# Patient Record
Sex: Female | Born: 1960
Health system: Southern US, Community
[De-identification: ages and names within clinical notes are randomized; demographics above are authoritative.]

## PROBLEM LIST (undated history)

## (undated) DIAGNOSIS — K8020 Calculus of gallbladder without cholecystitis without obstruction: Secondary | ICD-10-CM

## (undated) DIAGNOSIS — K219 Gastro-esophageal reflux disease without esophagitis: Secondary | ICD-10-CM

## (undated) DIAGNOSIS — E785 Hyperlipidemia, unspecified: Secondary | ICD-10-CM

## (undated) DIAGNOSIS — R918 Other nonspecific abnormal finding of lung field: Secondary | ICD-10-CM

## (undated) DIAGNOSIS — C20 Malignant neoplasm of rectum: Principal | ICD-10-CM

## (undated) DIAGNOSIS — N281 Cyst of kidney, acquired: Secondary | ICD-10-CM

## (undated) HISTORY — DX: Hyperlipidemia, unspecified: E78.5

## (undated) HISTORY — PX: BIOPSY BREAST: PRO8

## (undated) HISTORY — PX: MULTIPLE TOOTH EXTRACTIONS: SHX2053

## (undated) HISTORY — DX: Malignant neoplasm of rectum: C20

## (undated) HISTORY — PX: FOOT GANGLION EXCISION: SHX1660

## (undated) HISTORY — PX: POLYPECTOMY: SHX149

## (undated) HISTORY — DX: Gastro-esophageal reflux disease without esophagitis: K21.9

---

## 1998-07-07 ENCOUNTER — Other Ambulatory Visit: Admission: RE | Admit: 1998-07-07 | Discharge: 1998-07-07 | Payer: Self-pay | Admitting: Obstetrics and Gynecology

## 1999-07-16 ENCOUNTER — Other Ambulatory Visit: Admission: RE | Admit: 1999-07-16 | Discharge: 1999-07-16 | Payer: Self-pay | Admitting: Obstetrics and Gynecology

## 1999-09-17 HISTORY — PX: INGUINAL HERNIA REPAIR: SUR1180

## 2000-08-22 ENCOUNTER — Other Ambulatory Visit: Admission: RE | Admit: 2000-08-22 | Discharge: 2000-08-22 | Payer: Self-pay | Admitting: Obstetrics and Gynecology

## 2001-12-17 ENCOUNTER — Other Ambulatory Visit: Admission: RE | Admit: 2001-12-17 | Discharge: 2001-12-17 | Payer: Self-pay | Admitting: Obstetrics and Gynecology

## 2002-01-11 ENCOUNTER — Ambulatory Visit (HOSPITAL_BASED_OUTPATIENT_CLINIC_OR_DEPARTMENT_OTHER): Admission: RE | Admit: 2002-01-11 | Discharge: 2002-01-11 | Payer: Self-pay | Admitting: *Deleted

## 2003-01-11 ENCOUNTER — Other Ambulatory Visit: Admission: RE | Admit: 2003-01-11 | Discharge: 2003-01-11 | Payer: Self-pay | Admitting: Obstetrics and Gynecology

## 2004-02-14 ENCOUNTER — Other Ambulatory Visit: Admission: RE | Admit: 2004-02-14 | Discharge: 2004-02-14 | Payer: Self-pay | Admitting: Obstetrics and Gynecology

## 2005-01-29 ENCOUNTER — Other Ambulatory Visit: Admission: RE | Admit: 2005-01-29 | Discharge: 2005-01-29 | Payer: Self-pay | Admitting: Obstetrics and Gynecology

## 2007-01-02 ENCOUNTER — Ambulatory Visit (HOSPITAL_COMMUNITY): Admission: RE | Admit: 2007-01-02 | Discharge: 2007-01-02 | Payer: Self-pay | Admitting: Orthopedic Surgery

## 2007-02-04 ENCOUNTER — Ambulatory Visit (HOSPITAL_BASED_OUTPATIENT_CLINIC_OR_DEPARTMENT_OTHER): Admission: RE | Admit: 2007-02-04 | Discharge: 2007-02-04 | Payer: Self-pay | Admitting: Orthopedic Surgery

## 2010-04-11 ENCOUNTER — Encounter (INDEPENDENT_AMBULATORY_CARE_PROVIDER_SITE_OTHER): Payer: Self-pay | Admitting: *Deleted

## 2010-10-16 NOTE — Letter (Signed)
Summary: Previsit letter  [facility]  [address] [place], [place] [address]   Phone: [phone]  Fax: [phone]       04/11/2010 MRN: [id number]  [facility] [address] [place], [place]  [address]  Dear Ms. Monteverde,  Welcome to the Gastroenterology Division at [facility]    You are scheduled to see a nurse for your pre-procedure visit on 05/17/2010 at 10:30AM on the 3rd floor at [facility], [address]  We ask that you try to arrive at our office 15 minutes prior to your appointment time to allow for check-in.  Your nurse visit will consist of discussing your medical and surgical history, your immediate family medical history, and your medications.    Please bring a complete list of all your medications or, if you prefer, bring the medication bottles and we will list them.  We will need to be aware of both prescribed and over the counter drugs.  We will need to know exact dosage information as well.  If you are on blood thinners (Coumadin, Plavix, Aggrenox, Ticlid, etc.) please call our office today/prior to your appointment, as we need to consult with your physician about holding your medication.   Please be prepared to read and sign documents such as consent forms, a financial agreement, and acknowledgement forms.  If necessary, and with your consent, a friend or relative is welcome to sit-in on the nurse visit with you.  Please bring your insurance card so that we may make a copy of it.  If your insurance requires a referral to see a specialist, please bring your referral form from your primary care physician.  No co-pay is required for this nurse visit.     If you cannot keep your appointment, please call [phone] to cancel or reschedule prior to your appointment date.  This allows [place] the opportunity to schedule an appointment for another patient in need of care.    Thank you for choosing [facility] for your medical needs.  We  appreciate the opportunity to care for you.  Please visit [place] at our website  to learn more about our practice.                     Sincerely.                                                                                                                   The Gastroenterology Division

## 2011-10-01 ENCOUNTER — Ambulatory Visit (AMBULATORY_SURGERY_CENTER): Payer: 59 | Admitting: *Deleted

## 2011-10-01 VITALS — Ht 62.0 in | Wt 132.0 lb

## 2011-10-01 DIAGNOSIS — Z1211 Encounter for screening for malignant neoplasm of colon: Secondary | ICD-10-CM

## 2011-10-01 MED ORDER — PEG-KCL-NACL-NASULF-NA ASC-C 100 G PO SOLR: ORAL | Status: DC

## 2011-10-15 ENCOUNTER — Encounter: Payer: Self-pay | Admitting: Internal Medicine

## 2011-10-15 ENCOUNTER — Ambulatory Visit (AMBULATORY_SURGERY_CENTER): Payer: 59 | Admitting: Internal Medicine

## 2011-10-15 ENCOUNTER — Other Ambulatory Visit: Payer: Self-pay | Admitting: Internal Medicine

## 2011-10-15 VITALS — BP 144/98 | HR 82 | Temp 98.2°F | Resp 15 | Ht 62.0 in | Wt 132.0 lb

## 2011-10-15 DIAGNOSIS — Z1211 Encounter for screening for malignant neoplasm of colon: Secondary | ICD-10-CM

## 2011-10-15 DIAGNOSIS — K5730 Diverticulosis of large intestine without perforation or abscess without bleeding: Secondary | ICD-10-CM

## 2011-10-15 DIAGNOSIS — D126 Benign neoplasm of colon, unspecified: Secondary | ICD-10-CM

## 2011-10-15 MED ORDER — SODIUM CHLORIDE 0.9 % IV SOLN: 500.0000 mL | INTRAVENOUS | Status: DC

## 2011-10-15 NOTE — Patient Instructions (Signed)
DISCHARGE INSTRUCTIONS GIVEN WITH VERBAL UNDERSTANDING. HANDOUTS ON POLYPS,DIVERTICULOSIS AND A HIGH  DIET GIVEN. RESUME PREVIOUS MEDICATIONS.

## 2011-10-15 NOTE — Progress Notes (Signed)
Pressure applied to abdomen to reach cecum.  

## 2011-10-15 NOTE — Op Note (Signed)
[facility] [address] [place], [place]  [address]  COLONOSCOPY PROCEDURE REPORT  PATIENT:  Cheryl Holland, Cheryl Holland  MR#:  [id number] BIRTHDATE:  1961-07-10, 50 yrs. old  GENDER:  female ENDOSCOPIST:  Wilhemina Bonito. Eda Keys, MD REF. BY:  Marcelle Overlie, M.D. PROCEDURE DATE:  10/15/2011 PROCEDURE:  Colonoscopy with snare polypectomy x 5 ASA CLASS:  Class I INDICATIONS:  Routine Risk Screening MEDICATIONS:   MAC sedation, administered by CRNA, propofol (Diprivan) 800 mg IV  DESCRIPTION OF PROCEDURE:   After the risks benefits and alternatives of the procedure were thoroughly explained, informed consent was obtained.  Digital rectal exam was performed and revealed no abnormalities.   The LB [id number] endoscope was introduced through the anus and advanced to the cecum, which was identified by both the appendix and ileocecal valve, without limitations.  The quality of the prep was excellent, using MoviPrep.  The instrument was then slowly withdrawn as the colon was fully examined. <<PROCEDUREIMAGES>>  FINDINGS:  Two 2mm polyps were found in the cecum and snared without cautery. Retrieval was successful. Two 5-50mm polyps were found in the descending colon and snared without cautery. Retrieval was successful.  A large 4cm broad based pedunculated polyp mass was found in the rectum at 10cm from the anal verge, just beyong the first fold. Polyp was removed piecemeal with monopolar cautery. Retrieval was successful. Mild diverticulosis was found in the sigmoid colon.   Retroflexed views in the rectum revealed no abnormalities.    The time to cecum = 5:16  minutes. The scope was then withdrawn in 30:55  minutes from the cecum and the procedure completed.  COMPLICATIONS:  None ENDOSCOPIC IMPRESSION: 1) Two polyps in the cecum - removed 2) Two polyps in the descending colon - removed 3) Large Pedunculated polyp mass in the rectum - removed piecemeal 4) Mild diverticulosis in the  sigmoid colon  RECOMMENDATIONS: 1) Repeat Colonoscopy in 3 MONTHS if no cancer in large large lesion  ______________________________ Wilhemina Bonito. Eda Keys, MD  CC:  Geoffry Paradise, MD;  Marcelle Overlie, MD;  The Patient  n. eSIGNED:   Wilhemina Bonito. Eda Keys at 10/15/2011 10:08 AM  [place], Phil Dopp, [phone]

## 2011-10-15 NOTE — Progress Notes (Signed)
2nd bag of ivf hung when patient entered room. Third bag of IVF hung during procedure.

## 2011-10-15 NOTE — Progress Notes (Signed)
Patient did not experience any of the following events: a burn prior to discharge; a fall within the facility; wrong site/side/patient/procedure/implant event; or a hospital transfer or hospital admission upon discharge from the facility. (G8907) Patient did not have preoperative order for IV antibiotic SSI prophylaxis. (G8918)  

## 2011-10-16 ENCOUNTER — Telehealth: Payer: Self-pay

## 2011-10-16 DIAGNOSIS — C20 Malignant neoplasm of rectum: Secondary | ICD-10-CM

## 2011-10-16 NOTE — Telephone Encounter (Signed)
Message copied by Donata Duff on Wed Oct 16, 2011  4:56 PM ------      Message from: Hilarie Fredrickson      Created: Wed Oct 16, 2011  4:46 PM       Armarion Greek,            This is the patient needs an endoscopic ultrasound (rectal) with tattooing. Call the patient tomorrow (Thursday). Thank you again            Dr. Marina Goodell

## 2011-10-16 NOTE — Telephone Encounter (Signed)
  Follow up Call-  Call back number 10/15/2011  Post procedure Call Back phone  # [phone]  Permission to leave phone message Yes     Patient questions:  Do you have a fever, pain , or abdominal swelling? no Pain Score  0 *  Have you tolerated food without any problems? yes  Have you been able to return to your normal activities? yes  Do you have any questions about your discharge instructions: Diet   no Medications  no Follow up visit  no  Do you have questions or concerns about your Care? no  Actions: * If pain score is 4 or above: No action needed, pain <4.

## 2011-10-16 NOTE — Telephone Encounter (Signed)
Dr Christella Hartigan called and asked to have the pt scheduled for Friday 10/18/11 at 8 am for Lower EUS with flex prep for rectal cancer.  Pt to be called on 10/17/11.  Case is put in I will call ENDO tomorrow morning

## 2011-10-17 ENCOUNTER — Other Ambulatory Visit (INDEPENDENT_AMBULATORY_CARE_PROVIDER_SITE_OTHER): Payer: 59

## 2011-10-17 ENCOUNTER — Other Ambulatory Visit: Payer: Self-pay

## 2011-10-17 ENCOUNTER — Telehealth (INDEPENDENT_AMBULATORY_CARE_PROVIDER_SITE_OTHER): Payer: Self-pay

## 2011-10-17 ENCOUNTER — Other Ambulatory Visit: Payer: Self-pay | Admitting: Internal Medicine

## 2011-10-17 ENCOUNTER — Telehealth: Payer: Self-pay

## 2011-10-17 ENCOUNTER — Telehealth: Payer: Self-pay | Admitting: Oncology

## 2011-10-17 DIAGNOSIS — K6289 Other specified diseases of anus and rectum: Secondary | ICD-10-CM

## 2011-10-17 DIAGNOSIS — R198 Other specified symptoms and signs involving the digestive system and abdomen: Secondary | ICD-10-CM

## 2011-10-17 LAB — CBC WITH DIFFERENTIAL/PLATELET
Basophils Absolute: 0 10*3/uL (ref 0.0–0.1)
Basophils Relative: 0.5 % (ref 0.0–3.0)
Eosinophils Absolute: 0 10*3/uL (ref 0.0–0.7)
Eosinophils Relative: 0.1 % (ref 0.0–5.0)
HCT: 41.8 % (ref 36.0–46.0)
Hemoglobin: 14.4 g/dL (ref 12.0–15.0)
Lymphocytes Relative: 9.8 % — ABNORMAL LOW (ref 12.0–46.0)
Lymphs Abs: 1 10*3/uL (ref 0.7–4.0)
MCHC: 34.5 g/dL (ref 30.0–36.0)
MCV: 87.7 fl (ref 78.0–100.0)
Monocytes Absolute: 0.4 10*3/uL (ref 0.1–1.0)
Monocytes Relative: 4.5 % (ref 3.0–12.0)
Neutro Abs: 8.3 10*3/uL — ABNORMAL HIGH (ref 1.4–7.7)
Neutrophils Relative %: 85.1 % — ABNORMAL HIGH (ref 43.0–77.0)
Platelets: 310 10*3/uL (ref 150.0–400.0)
RBC: 4.77 Mil/uL (ref 3.87–5.11)
RDW: 13.1 % (ref 11.5–14.6)
WBC: 9.8 10*3/uL (ref 4.5–10.5)

## 2011-10-17 LAB — COMPREHENSIVE METABOLIC PANEL
ALT: 25 U/L (ref 0–35)
AST: 24 U/L (ref 0–37)
Albumin: 4.8 g/dL (ref 3.5–5.2)
Alkaline Phosphatase: 72 U/L (ref 39–117)
BUN: 15 mg/dL (ref 6–23)
CO2: 29 mEq/L (ref 19–32)
Calcium: 10.2 mg/dL (ref 8.4–10.5)
Chloride: 101 mEq/L (ref 96–112)
Creatinine, Ser: 1 mg/dL (ref 0.4–1.2)
GFR: 64.2 mL/min (ref >60.00)
Glucose, Bld: 94 mg/dL (ref 70–99)
Potassium: 3.9 mEq/L (ref 3.5–5.1)
Sodium: 142 mEq/L (ref 135–145)
Total Bilirubin: 1.4 mg/dL — ABNORMAL HIGH (ref 0.3–1.2)
Total Protein: 7.8 g/dL (ref 6.0–8.3)

## 2011-10-17 LAB — PROTIME-INR
INR: 1 ratio (ref 0.8–1.0)
Prothrombin Time: 11.4 s (ref 10.2–12.4)

## 2011-10-17 LAB — APTT: aPTT: 24.6 s (ref 21.7–28.8)

## 2011-10-17 NOTE — Telephone Encounter (Signed)
Notified pt of her appt with Dr Michaell Cowing for 10-23-11 arrive at 12:15.

## 2011-10-17 NOTE — Telephone Encounter (Signed)
Left message on machine to call back  

## 2011-10-17 NOTE — Telephone Encounter (Signed)
Dr Arbie Cookey has been given the pt instructions for his wife.  I also gave him the CT information and advised him that Dr Kalman Drape office should be in touch.  Contrast and instructions have been left at the front desk.  I advised Selinda Michaels RN that the information was given.  I DID NOT give Dr Arbie Cookey the information on the labs, Bonita Quin was given Mrs. Vohra's cell number to contact her regarding the labs.

## 2011-10-17 NOTE — Telephone Encounter (Signed)
Cheryl Holland,  The patient needs a contrast-enhanced CT scan of the chest, abdomen, and pelvis. "Rectal cancer, rule out metastasis". She also needs a CEA level, CBC, PT/PTT, and comprehensive metabolic panel. Also, contact Dr. Kalman Drape nurse and left her know that I spoke with him and that we talked about getting [name] in to see him next week. They should facilitate this and call the patient.  Thank you, jp  Labs ordered. Pt scheduled for CT scan at [facility] CT 10/21/11@2pm . Pt to be NPO after 10am. Drink 1st bottle of contrast at 12noon and 2nd bottle at 1pm. Message left for pt to call back. Message left for Dr. Kalman Drape nurse regarding an appt for the pt.

## 2011-10-17 NOTE — Telephone Encounter (Signed)
S/w pt's husband re appt for 2/6 @ 10 am w/BS. Husband given d/t/location/my name/#.

## 2011-10-17 NOTE — Telephone Encounter (Signed)
Message copied by Michele Mcalpine on Thu Oct 17, 2011  3:37 PM ------      Message from: Hilarie Fredrickson      Created: Thu Oct 17, 2011  3:34 PM       Let patient know that her general labs are all normal. Awaiting CEA.

## 2011-10-17 NOTE — Telephone Encounter (Signed)
Pt aware.

## 2011-10-17 NOTE — Telephone Encounter (Signed)
Message left with the receptionist at Dr Bosie Helper office to have him call me with his wife's instructions for her procedure

## 2011-10-17 NOTE — Telephone Encounter (Signed)
Pt aware of CT appt and instructions. She will come by today for labs and pick up the contrast.

## 2011-10-18 ENCOUNTER — Encounter (HOSPITAL_COMMUNITY): Payer: Self-pay

## 2011-10-18 ENCOUNTER — Encounter (HOSPITAL_COMMUNITY)
Admission: RE | Disposition: A | Discharge status: Home / Self Care | Payer: Self-pay | Source: Ambulatory Visit | Attending: Gastroenterology

## 2011-10-18 ENCOUNTER — Telehealth: Payer: Self-pay

## 2011-10-18 ENCOUNTER — Ambulatory Visit (HOSPITAL_COMMUNITY)
Admission: RE | Admit: 2011-10-18 | Discharge: 2011-10-18 | Disposition: A | Discharge status: Home / Self Care | Payer: 59 | Source: Ambulatory Visit | Attending: Gastroenterology | Admitting: Gastroenterology

## 2011-10-18 DIAGNOSIS — C20 Malignant neoplasm of rectum: Secondary | ICD-10-CM

## 2011-10-18 DIAGNOSIS — K626 Ulcer of anus and rectum: Secondary | ICD-10-CM | POA: Insufficient documentation

## 2011-10-18 HISTORY — PX: EUS: SHX5427

## 2011-10-18 HISTORY — DX: Malignant neoplasm of rectum: C20

## 2011-10-18 LAB — CEA: CEA: <0.5 ng/mL (ref 0.0–5.0)

## 2011-10-18 SURGERY — ULTRASOUND, LOWER GI TRACT, ENDOSCOPIC
Anesthesia: Moderate Sedation

## 2011-10-18 MED ORDER — SPOT INK MARKER SYRINGE KIT
PACK | SUBMUCOSAL | Status: DC | PRN
  Administered 2011-10-18: 2 mL via SUBMUCOSAL

## 2011-10-18 MED ORDER — MIDAZOLAM HCL 10 MG/2ML IJ SOLN
INTRAMUSCULAR | Status: AC
  Filled 2011-10-18: qty 4

## 2011-10-18 MED ORDER — FENTANYL CITRATE 0.05 MG/ML IJ SOLN
INTRAMUSCULAR | Status: DC | PRN
  Administered 2011-10-18 (×4): 25 ug via INTRAVENOUS

## 2011-10-18 MED ORDER — DIPHENHYDRAMINE HCL 50 MG/ML IJ SOLN
INTRAMUSCULAR | Status: AC
  Filled 2011-10-18: qty 1

## 2011-10-18 MED ORDER — FENTANYL CITRATE 0.05 MG/ML IJ SOLN
INTRAMUSCULAR | Status: AC
  Filled 2011-10-18: qty 4

## 2011-10-18 MED ORDER — SODIUM CHLORIDE 0.9 % IV SOLN: Freq: Once | INTRAVENOUS | Status: DC

## 2011-10-18 MED ORDER — SPOT INK MARKER SYRINGE KIT
PACK | SUBMUCOSAL | Status: AC
  Filled 2011-10-18: qty 5

## 2011-10-18 MED ORDER — MIDAZOLAM HCL 10 MG/2ML IJ SOLN
INTRAMUSCULAR | Status: DC | PRN
  Administered 2011-10-18 (×5): 2 mg via INTRAVENOUS

## 2011-10-18 NOTE — Op Note (Signed)
[facility] [address] [place], [place]  [address]  ENDOSCOPIC ULTRASOUND PROCEDURE REPORT  PATIENT:  Cheryl Holland, Cheryl Holland  MR#:  [id number] BIRTHDATE:  Jun 07, 1961  GENDER:  female ENDOSCOPIST:  Rachael Fee, MD REFERRED BY:  Wilhemina Bonito. Eda Keys., M.D. PROCEDURE DATE:  10/18/2011 PROCEDURE:  Lower EUS ASA CLASS:  Class II INDICATIONS:  recently diagnosed rectal adenocarcinoma MEDICATIONS:  Fentanyl 100 mcg IV, Versed 10 mg IV  DESCRIPTION OF PROCEDURE:   After the risks benefits and alternatives of the procedure were  explained, informed consent was obtained. The patient was then placed in the left, lateral, decubitus postion and IV sedation was administered. Throughout the procedure, the patient's blood pressure, pulse and oxygen saturations were monitored continuously.  Under direct visualization, the Radial EUS [id number] and Pentax Ped Colon [id number] endoscope was introduced through the anus and advanced to the sigmoid colon.  Water was used as necessary to provide an acoustic interface.  Upon completion of the imaging, water was removed and the patient was sent to the recovery room in satisfactory condition. <<PROCEDUREIMAGES>>  Sigmoidoscopic findings: 1. 1.5cm clean based ulcer crater located 8cm from anal verge. There was no remaining adenomatous or neoplastic appearing mucos. Following EUS examinatio the two lateral edges of the ulcer crater was tatoo'd with [place] Ink. 2. Otherwise normal examnation to descending colon.  EUS findings: 1. The ulcer crater was visible by EUS and it measured 1.4cm across. There was minor edema at the site but no sign of any deeper lesion.  The muscularis propria layer was normal throughout rectum.  Impression: I suspect that the cancer has been completely removed. If there is any remaining neoplasm it is not visible by EUS.  If there is cancer remaining it would be uT0N0 or uT1N0 at most. The ulcer crater is 1.4cm  across, located 8cm from anal verge. Both lateral edges of the crater were injected with [place] Ink to aid in future localization.  I think transanal resection of the site is the appropriate next step.  ______________________________ Rachael Fee, MD  cc: Yancey Flemings, MD; Trude Mcburney, MD; Mancel Bale, MD  n. eSIGNED:   Rachael Fee at 10/18/2011 09:14 AM  [facility], Phil Dopp, [phone]

## 2011-10-18 NOTE — Progress Notes (Signed)
Blood drawn for CA 19-9 as ordered per lab

## 2011-10-18 NOTE — Telephone Encounter (Signed)
Message copied by Michele Mcalpine on Fri Oct 18, 2011 10:21 AM ------      Message from: Hilarie Fredrickson      Created: Fri Oct 18, 2011 10:15 AM       Let patient know CEA is normal.

## 2011-10-18 NOTE — H&P (Signed)
  HPI: This is a woman with recently diagnosed small rectal cancer      Past Medical History  Diagnosis Date  . Allergy     shimp    Past Surgical History  Procedure Date  . Foot ganglion excision 2008    right  . Inguinal hernia repair 2004    right  . Inguinal hernia repair     No current facility-administered medications for this encounter.    Allergies as of 10/16/2011 - Review Complete 10/15/2011  Allergen Reaction Noted  . Shrimp (shellfish allergy) Anaphylaxis, Hives, and Swelling 10/15/2011    Family History  Problem Relation Age of Onset  . Colon cancer Neg Hx     History   Social History  . Marital Status: Married    Spouse Name: N/A    Number of Children: N/A  . Years of Education: N/A   Occupational History  . Not on file.   Social History Main Topics  . Smoking status: Never Smoker   . Smokeless tobacco: Never Used  . Alcohol Use: 3.0 oz/week    5 Glasses of wine per week  . Drug Use: No  . Sexually Active: Not on file   Other Topics Concern  . Not on file   Social History Narrative  . No narrative on file      Physical Exam: BP 117/78  Temp 98.4 F (36.9 C)  Resp 13  Wt 124 lb (56.246 kg)  SpO2 99% Constitutional: generally well-appearing Psychiatric: alert and oriented x3 Abdomen: soft, nontender, nondistended, no obvious ascites, no peritoneal signs, normal bowel sounds     Assessment and plan: 51 y.o. female with rectal cancer  For EUS and tatooing of rectal cancer

## 2011-10-18 NOTE — Telephone Encounter (Signed)
Spoke with pt and she is aware.

## 2011-10-21 ENCOUNTER — Ambulatory Visit (INDEPENDENT_AMBULATORY_CARE_PROVIDER_SITE_OTHER)
Admission: RE | Admit: 2011-10-21 | Discharge: 2011-10-21 | Disposition: A | Discharge status: Home / Self Care | Payer: 59 | Source: Ambulatory Visit | Attending: Internal Medicine | Admitting: Internal Medicine

## 2011-10-21 DIAGNOSIS — R198 Other specified symptoms and signs involving the digestive system and abdomen: Secondary | ICD-10-CM

## 2011-10-21 DIAGNOSIS — K6289 Other specified diseases of anus and rectum: Secondary | ICD-10-CM

## 2011-10-21 MED ORDER — IOHEXOL 300 MG/ML  SOLN
100.0000 mL | Freq: Once | INTRAMUSCULAR | Status: AC | PRN
  Administered 2011-10-21: 100 mL via INTRAVENOUS

## 2011-10-22 ENCOUNTER — Telehealth: Payer: Self-pay

## 2011-10-22 DIAGNOSIS — N281 Cyst of kidney, acquired: Secondary | ICD-10-CM

## 2011-10-22 NOTE — Telephone Encounter (Signed)
Message copied by Michele Mcalpine on Tue Oct 22, 2011  1:29 PM ------      Message from: Hilarie Fredrickson      Created: Tue Oct 22, 2011 11:56 AM       Bonita Quin, please let the patient know that the CT does not show any overwhelming abnormalities. It does show incidental gallstones. A small cyst on the right kidney for which a renal ultrasound was recommended by the radiologist (please arrange at her convenience). And tiny nonspecific pulmonary nodules, which may need follow up CT of chest in 6 months (per radiology recommendation). I discussed these findings with her husband, Dr. Arbie Cookey, as well

## 2011-10-22 NOTE — Telephone Encounter (Signed)
Left message for pt to call back.  Spoke with pt and she is aware. Pt scheduled for renal ultrasound at [facility] arrival time 8:15am for an 8:30am appt. Pt aware of appt date and time.

## 2011-10-23 ENCOUNTER — Ambulatory Visit (INDEPENDENT_AMBULATORY_CARE_PROVIDER_SITE_OTHER): Payer: Commercial Managed Care - PPO | Admitting: Surgery

## 2011-10-23 ENCOUNTER — Telehealth: Payer: Self-pay | Admitting: Oncology

## 2011-10-23 ENCOUNTER — Other Ambulatory Visit: Payer: 59 | Admitting: Lab

## 2011-10-23 ENCOUNTER — Encounter (INDEPENDENT_AMBULATORY_CARE_PROVIDER_SITE_OTHER): Payer: Self-pay | Admitting: Surgery

## 2011-10-23 ENCOUNTER — Ambulatory Visit (HOSPITAL_BASED_OUTPATIENT_CLINIC_OR_DEPARTMENT_OTHER): Payer: 59

## 2011-10-23 ENCOUNTER — Ambulatory Visit (HOSPITAL_BASED_OUTPATIENT_CLINIC_OR_DEPARTMENT_OTHER): Payer: 59 | Admitting: Oncology

## 2011-10-23 VITALS — BP 111/70 | HR 71 | Temp 97.7°F | Ht 62.0 in | Wt 125.9 lb

## 2011-10-23 DIAGNOSIS — C20 Malignant neoplasm of rectum: Secondary | ICD-10-CM

## 2011-10-23 NOTE — Telephone Encounter (Signed)
called pt and scheduled appt for march2013 °

## 2011-10-23 NOTE — Progress Notes (Signed)
Subjective:     Patient ID: Cheryl Holland, female   DOB: [birth year], 51 y.o.   MRN: [id number]  HPI  Cheryl Holland  Sep 15, 1961 [id number]  Patient Care Team: Minda Meo, MD as PCP - General (Internal Medicine) Yancey Flemings, MD as Consulting Physician (Gastroenterology) Rob Bunting, MD as Consulting Physician (Gastroenterology)  This patient is a 51 y.o.female who presents today for surgical evaluation at the request of Drs. Bertell Maria.   She is a pleasant, physically active female. She underwent screening colonoscopy.  Dr. Marina Goodell found a 4 cm polyp in her proximal/mid rectum, ~10cm. Just at the level of the most distal rectal fold. It was excised. There was a high-grade dysplasia within it and a focus of adenocarcinoma as well.  She was sent for endoscopic ultrasound.   Dr. Christella Hartigan found an ulcer at 8 cm from the anal verge. No deeper cancer obviously seen. Area was tattooed. Based on concerns, she was sent to me for evaluation.   She has a bowel movement every day. Quite active. Had an open inguinal hernia repair on the right side. No other surgeries.  She comes today with her husband, Dr. Tawanna Cooler Holton, with vascular surgery.  Patient Active Problem List  Diagnoses  . Rectal cancer in mid-rectal polyp s/p polypectomy    Past Medical History  Diagnosis Date  . Allergy     shimp  . Cataract     colon  . Rectal cancer in mid-rectal polyp s/p polypectomy 10/18/2011    Past Surgical History  Procedure Date  . Foot ganglion excision 2004 - approximate    right  . Inguinal hernia repair 2001    right  . Inguinal hernia repair     History   Social History  . Marital Status: Married    Spouse Name: N/A    Number of Children: N/A  . Years of Education: N/A   Occupational History  . Not on file.   Social History Main Topics  . Smoking status: Never Smoker   . Smokeless tobacco: Never Used  . Alcohol Use: 3.0 oz/week    5 Glasses of wine per week     glass of wine 2  or 3 times per week  . Drug Use: No  . Sexually Active: Not on file   Other Topics Concern  . Not on file   Social History Narrative   Spouse is Dr. Gretta Began, vascular surgeon    Family History  Problem Relation Age of Onset  . Colon cancer Neg Hx   . Dementia Father     Current outpatient prescriptions:calcium carbonate (OS-CAL) 600 MG TABS, Take 600 mg by mouth 2 (two) times daily with a meal., Disp: , Rfl: ;  cholecalciferol (VITAMIN D) 1000 UNITS tablet, Take 1,000 Units by mouth daily., Disp: , Rfl: ;  EPINEPHrine (EPIPEN JR) 0.15 MG/0.3ML injection, Inject 0.15 mg into the muscle as needed., Disp: , Rfl: ;  fish oil-omega-3 fatty acids 1000 MG capsule, Take 1 g by mouth daily., Disp: , Rfl:  furosemide (LASIX) 40 MG tablet, Take 40 mg by mouth daily., Disp: , Rfl: ;  Multiple Vitamins-Minerals (MULTIVITAMIN WITH MINERALS) tablet, Take 1 tablet by mouth daily., Disp: , Rfl: ;  potassium chloride (KLOR-CON) 8 MEQ tablet, Take 8 mEq by mouth daily., Disp: , Rfl:   Allergies  Allergen Reactions  . Shrimp (Shellfish Allergy) Anaphylaxis, Hives and Swelling    BP 116/78  Pulse 66  Temp(Src) 97.9 F (  36.6 C) (Temporal)  Resp 16  Ht 5' 2.5" (1.588 m)  Wt 125 lb 6.4 oz (56.881 kg)  BMI 22.57 kg/m2     Review of Systems  Constitutional: Negative for fever, chills, diaphoresis, appetite change and fatigue.  HENT: Negative for ear pain, sore throat, trouble swallowing, neck pain and ear discharge.   Eyes: Negative for photophobia, discharge and visual disturbance.  Respiratory: Negative for cough, choking, chest tightness and shortness of breath.   Cardiovascular: Negative for chest pain and palpitations.       Runs 5-8 miles regularly  Gastrointestinal: Negative for nausea, vomiting, abdominal pain, diarrhea, constipation, anal bleeding and rectal pain.       BM daily  Genitourinary: Negative for dysuria, frequency and difficulty urinating.  Musculoskeletal: Negative for  myalgias and gait problem.  Skin: Negative for color change, pallor and rash.       No h/o MRSA / skin infections  Neurological: Negative for dizziness, speech difficulty, weakness and numbness.  Hematological: Negative for adenopathy.  Psychiatric/Behavioral: Negative for confusion and agitation. The patient is not nervous/anxious.        Objective:   Physical Exam  Constitutional: She is oriented to person, place, and time. She appears well-developed and well-nourished. No distress.  HENT:  Head: Normocephalic.  Mouth/Throat: Oropharynx is clear and moist. No oropharyngeal exudate.  Eyes: Conjunctivae and EOM are normal. Pupils are equal, round, and reactive to light. No scleral icterus.  Neck: Normal range of motion. Neck supple. No tracheal deviation present.  Cardiovascular: Normal rate, regular rhythm and intact distal pulses.   Pulmonary/Chest: Effort normal and breath sounds normal. No respiratory distress. She exhibits no tenderness.  Abdominal: Soft. She exhibits no distension and no mass. There is no tenderness. Hernia confirmed negative in the right inguinal area and confirmed negative in the left inguinal area.  Genitourinary: No vaginal discharge found.       Perianal skin clean with good hygiene.  No pruritis.  No pilonidal disease.  No fissure.  No abscess/fistula.    Tolerates digital rectal exam.  Normal sphincter tone.   No rectal masses.  Hemorrhoidal piles WNL  R lat wall ?ulcer felt ~8cm from anal verge   Musculoskeletal: Normal range of motion. She exhibits no tenderness.  Lymphadenopathy:    She has no cervical adenopathy.       Right: No inguinal adenopathy present.       Left: No inguinal adenopathy present.  Neurological: She is alert and oriented to person, place, and time. No cranial nerve deficit. She exhibits normal muscle tone. Coordination normal.  Skin: Skin is warm and dry. No rash noted. She is not diaphoretic. No erythema.  Psychiatric: She has  a normal mood and affect. Her speech is normal and behavior is normal. Judgment and thought content normal. Her mood appears not anxious. She does not exhibit a depressed mood.       Pleasant, smiling       Assessment:     AdenoCA & HG dysplasia within mid-rectal polyp s/p polypectomy.      Plan:     Given the fact that the polyp had cancer within it & the margin was close, I think she needs more definitive excisional biopsy resection for better margins. I think she is a good candidate for TEM to do that. This would avoid strating with a low anterior resection with a possible diverting loop ileostomy. Did have a long discussion with she and her husband, Dr. Tawanna Cooler Toft.  The anatomy & physiology of the digestive tract was discussed.  The pathophysiology of the rectal pathology was discussed.  Natural history risks without surgery was discussed.   I feel the risks of no intervention will lead to serious problems that outweigh the operative risks; therefore, I recommended surgery.    Laparoscopic & open abdominal techniques were discussed.  I recommended we start with a partial proctectomy by transanal endoscopic microsurgery (TEM) for excisional biopsy to remove the pathology and hopefully cure and/or control the pathology.  This technique can offer less operative risk and faster post-operative recovery.  Possible need for immediate or later abdominal surgery for further treatment was discussed.   Risks such as bleeding, abscess, reoperation, heart attack, death, and other risks were discussed.   I noted a good likelihood this will help address the problem.  Goals of post-operative recovery were discussed as well.  We will work to minimize complications.  An educational handout was given as well.  Questions were answered.  The patient expresses understanding & wishes to proceed with surgery.

## 2011-10-23 NOTE — Patient Instructions (Signed)
  o TRANSANAL ENDOSCOPIC MICROSURGERY o  o Anatomy of the rectum.   o  o The rectum is the lower part of the colon that resides in the pelvis.  It is the final location of stool before it is evacuated through the anus in the process of defecation.  The rectum is an area where unfortunately polyps or a cancer can develop.  In instances of most pre-cancerous lesions, a person often does not need to have a large resection of the rectum, but have it excised by an endoscope with loop and snares.  Unfortunately some polyps are too large to be safely excised through endoscopy and require surgery.  Classically, this is done through an open incision through a low anterior resection or abdominoperineal resection where part or the entire rectum is removed.  However, sometimes only part of a wall of the rectum needs to be removed.  o Transanal endoscopic microsurgery (TEM) was developed as a means to provide a good regional resection of part of the rectal wall for a pre-cancerous lesion or in resection of cancers in which the patient cannot tolerate an open surgery or has an extremely hostile abdomen that makes the resection very risky.   o Transanal endoscopic microsurgery (TEM) involves the patient to be placed under complete general anesthesia. The patient is usually positioned on their back in stirrups or sometimes on their bottom.  The anus is gently dilated and a metal tube is placed into the rectum.   o  o Through the tube, air is inflated and long instruments are used to help access and cut out the abnormal polyp or tumor.  The long instruments are also used to help sew the hole shut.  The specimen is then sent for pathology.  The procedure itself usually takes a few hours of time.  The patient usually stays overnight.  When they can tolerate a regular diet and have adequate pain control they usually leave in one or two days.   o The advantage of TEM is that as opposed to a long hospital stay, patient recovery  is much faster and are less likely to have bowel or other problems.  Careful pre-operative selection is essential to make sure that the patient is an appropriate candidate for the surgery.  Tumors or cancers that are very large or invasive usually are much more difficult to remove by this technique and are not considered the first option.  Persons who are most appropriate for this surgery are those with large polyps that have not become cancers or Bark cancers in patients who have high risks with larger surgery.    o  o Risks to the surgery are inherent but overall the procedure is less stressful and less risky to the patient than a classic partial colon resection.   o  

## 2011-10-23 NOTE — Progress Notes (Signed)
Referring MD: Yancey Flemings   Cheryl Holland 51 y.o.  [birth year]    Reason for Referral: New diagnosis of rectal cancer     HPI: She saw Dr. Marina Holland for a routine screening colonoscopy on 01/29/Holland. 22 mm polyps were found in the cecum and were removed. 25-6 mm polyps were found in the descending colon and were removed. A large 4 cm broad-based pedunculated polyp was found in the rectum at 10 cm from the anal verge. The polyp was removed in a piecemeal fashion. There was mild diverticulosis in the sigmoid colon. The pathology [id number]) revealed a tubular adenomas at the cecum, and hyperplastic polyps at the descending colon. The biopsy from the rectum revealed fragments of a tubular adenoma with a 0.7 cm focus of invasive adenocarcinoma closely approximating the cauterized edge. High-grade dysplasia was present measuring at least 1.1 cm in greatest dimension. Lymphatic/vascular invasion was not identified.  She was referred to Dr. Brooke Holland underwent an endoscopic rectal ultrasound procedure on 02/01/Holland a 1.5 cm ulcer crater was located 8 cm from the anal verge. No remaining adenomatous or neoplastic-appearing mucosa. The ulcer was tattooed. The ulcer crater was visible by EUS and measured 1.4 cm. Minor edema was noted at the site but no sign of a deeper lesion. The muscularis propria layer was normal throughout the rectum.  She was referred for CT scans of the chest, abdomen, and pelvis on 02/05/Holland. Multiple small nonspecific pulmonary nodules were noted with the largest measuring 6 mm in the periphery of the right lower lobe. These were felt to be nonspecific. A 1.2 cm indeterminate lesion was noted in the lateral aspect of the right kidney. The liver, pancreas, spleen, and adrenal glands appeared unremarkable. No pathologic lymphadenopathy . A small amount of soft tissue thickening was noted in the rectum.  She reports feeling completely well prior to the colonoscopy procedure.  Past Medical  History  Diagnosis Date  . Allergy     shimp  . Cataract     colon  . Rectal cancer in mid-rectal polyp s/p polypectomy 2/1/Holland   . G3 P3, menopause for the past 6 months  Past Surgical History  Procedure Date  . Foot ganglion excision 2004 - approximate    right  . Inguinal hernia repair 2001    right        Family History  Problem Relation Age of Onset  . Colon cancer Neg Hx   . Dementia Father    no family history of cancer including:, Rectal, breast, bladder, and uterine cancers  Current outpatient prescriptions:calcium carbonate (OS-CAL) 600 MG TABS, Take 600 mg by mouth 2 (two) times daily with a meal., Disp: , Rfl: ;  cholecalciferol (VITAMIN D) 1000 UNITS tablet, Take 1,000 Units by mouth daily., Disp: , Rfl: ;  EPINEPHrine (EPIPEN JR) 0.15 MG/0.3ML injection, Inject 0.15 mg into the muscle as needed., Disp: , Rfl: ;  fish oil-omega-3 fatty acids 1000 MG capsule, Take 1 g by mouth daily., Disp: , Rfl:  furosemide (LASIX) 40 MG tablet, Take 40 mg by mouth daily., Disp: , Rfl: ;  Multiple Vitamins-Minerals (MULTIVITAMIN WITH MINERALS) tablet, Take 1 tablet by mouth daily., Disp: , Rfl: ;  potassium chloride (KLOR-CON) 8 MEQ tablet, Take 8 mEq by mouth daily., Disp: , Rfl:   Allergies:  Allergies  Allergen Reactions  . Shrimp (Shellfish Allergy) Anaphylaxis, Hives and Swelling    Social History:   She lives with her husband and Cheryl Holland. She is currently a homemaker and  was previously a Engineer, civil (consulting). No transfusion history. No risk factor for HIV or hepatitis.  History  Alcohol Use  . 3.0 oz/week  . 5 Glasses of wine per week    glass of wine 2 or 3 times per week    History  Smoking status  . Never Smoker   Smokeless tobacco  . Never Used     ROS:   Positives include: None  A complete ROS was  negative.  Physical Exam:  Blood pressure 111/70, pulse 71, temperature 97.7 F (36.5 C), height 5\' 2"  (1.575 m), weight 125 lb 14.4 oz (57.108 kg).  HEENT:  Oropharynx without visible mass. Neck without mass. Lungs: Clear bilaterally Cardiac: Regular rate and rhythm Abdomen: No hepatosplenomegaly, no mass  Vascular: No leg edema Lymph nodes: No cervical, supraclavicular, axillary, or inguinal lymph nodes Neurologic: Alert and oriented. The motor examination appears grossly intact. Skin: No rash   LAB: CEA less than 0.5 on Cheryl Holland  CBC  Lab Results  Component Value Date   WBC 9.8 1/31/Holland   HGB 14.4 1/31/Holland   HCT 41.8 1/31/Holland   MCV 87.7 1/31/Holland   PLT 310.0 1/31/Holland     CMP      Component Value Date/Time   NA 142 1/31/Holland 1246   K 3.9 1/31/Holland 1246   CL 101 1/31/Holland 1246   CO2 29 1/31/Holland 1246   GLUCOSE 94 1/31/Holland 1246   BUN 15 1/31/Holland 1246   CREATININE 1.0 1/31/Holland 1246   CALCIUM 10.2 1/31/Holland 1246   PROT 7.8 1/31/Holland 1246   ALBUMIN 4.8 1/31/Holland 1246   AST 24 1/31/Holland 1246   ALT 25 1/31/Holland 1246   ALKPHOS 72 1/31/Holland 1246   BILITOT 1.4* 1/31/Holland 1246     Assessment/Plan:   1. Invasive adenocarcinoma involving a rectal polyp, status post endoscopic resection of the polyp with a close surgical margin  2. Multiple benign colon polyps  3. Nonspecific small pulmonary nodules-likely a benign finding   Disposition:   I discussed the diagnosis of rectal cancer with Ms. Cheryl Holland today. We discussed the standard staging and treatment algorithm. She appears to have an Kroner-stage lesion based on the diagnostic evaluation to date. She will see Dr. Michaell Holland later today to discuss definitive surgical treatment options. She will likely be a candidate for a transanal excision procedure to further stage and treat the invasive cancer. She will most likely not require adjuvant systemic chemotherapy or radiation.  We will request microsatellite instability testing on the invasive tumor from the rectal polyp.  The lung nodules are likely benign. We'll consider a restaging noncontrast chest CT at a six-month  interval.  She will return for further discussion and review of the surgical pathology in approximately one month. I will contact her husband to discuss the case later today.  Cheryl Holland Cheryl Holland 2/6/Holland, 3:50 PM

## 2011-10-24 ENCOUNTER — Telehealth: Payer: Self-pay

## 2011-10-24 ENCOUNTER — Ambulatory Visit (HOSPITAL_COMMUNITY)
Admission: RE | Admit: 2011-10-24 | Discharge: 2011-10-24 | Disposition: A | Discharge status: Home / Self Care | Payer: 59 | Source: Ambulatory Visit | Attending: Internal Medicine | Admitting: Internal Medicine

## 2011-10-24 DIAGNOSIS — N281 Cyst of kidney, acquired: Secondary | ICD-10-CM | POA: Insufficient documentation

## 2011-10-24 NOTE — Telephone Encounter (Signed)
Message copied by Michele Mcalpine on Thu Oct 24, 2011 10:58 AM ------      Message from: Hilarie Fredrickson      Created: Thu Oct 24, 2011 10:25 AM       Please let the patient know that the ultrasound shows only a simple cyst on the kidney. Nothing further needs to be done.

## 2011-10-24 NOTE — Telephone Encounter (Signed)
Pt aware.

## 2011-10-25 ENCOUNTER — Encounter (HOSPITAL_COMMUNITY): Payer: Self-pay | Admitting: Pharmacy Technician

## 2011-10-25 ENCOUNTER — Encounter (HOSPITAL_COMMUNITY): Payer: Self-pay | Admitting: Gastroenterology

## 2011-10-28 ENCOUNTER — Encounter (HOSPITAL_COMMUNITY)
Admission: RE | Admit: 2011-10-28 | Discharge: 2011-10-28 | Disposition: A | Discharge status: Home / Self Care | Payer: 59 | Source: Ambulatory Visit | Attending: Surgery | Admitting: Surgery

## 2011-10-28 ENCOUNTER — Telehealth: Payer: Self-pay | Admitting: *Deleted

## 2011-10-28 ENCOUNTER — Encounter (HOSPITAL_COMMUNITY): Payer: Self-pay

## 2011-10-28 HISTORY — DX: Calculus of gallbladder without cholecystitis without obstruction: K80.20

## 2011-10-28 HISTORY — DX: Cyst of kidney, acquired: N28.1

## 2011-10-28 HISTORY — DX: Other nonspecific abnormal finding of lung field: R91.8

## 2011-10-28 LAB — SURGICAL PCR SCREEN
MRSA, PCR: NEGATIVE
Staphylococcus aureus: NEGATIVE

## 2011-10-28 NOTE — Pre-Procedure Instructions (Addendum)
PT HAD CBC WITH DIFF, CMET, PT, PTT AND CEA DONE 10/17/11-RESULTS IN EPIC AND MAY BE USED FOR THIS SURGERY  SCHEDULED FOR 10/28/11--PER ANESTHESIOLOGIST'S GUIDELINES.  EKG NOT NEEDED. PT HAD CT CHEST 10/22/11-RESULTS IN EPIC. PURPLE TOP LAB TUBE WAS DRAWN TODAY AND TAKE TO [facility] IN LAB/PATHOLOGY ALONG WITH THE ODER FOR MICROSATTELITE INSTABILITY TESTING--THE ORDER WAS FROM DR. SHERRILL--COPY OF THE WRITTEN ORDER IS ON THIS CHART--AS THIS ORDER CAN'T BE ENTERED INTO EPIC.

## 2011-10-28 NOTE — Patient Instructions (Addendum)
19 Cheryl Holland  10/28/2011   Your procedure is scheduled on:  [facility] 2/13  AT 12:15 PM  Report to [facility] at 10:15 AM.  Call this number if you have problems the morning of surgery: [phone]   Remember:   Do not eat food OR DRINK ANYTHING AFTER MIDNIGHT THE NIGHT BEFORE YOUR SURGERY.    Take these medicines the morning of surgery with A SIP OF WATER: DO NOT TAKE ANY MEDICINES.   Do not wear jewelry, make-up or nail polish.  Do not wear lotions, powders, or perfumes.   Do not shave 48 hours prior to surgery.  Do not bring valuables to the hospital.  Contacts, dentures or bridgework may not be worn into surgery.  Leave suitcase in the car. After surgery it may be brought to your room.  For patients admitted to the hospital, checkout time is 11:00 AM the day of discharge.   Patients discharged the day of surgery will not be allowed to drive home.    Special Instructions: CHG Shower Use Special Wash: 1/2 bottle night before surgery and 1/2 bottle morning of surgery.   Please read over the following fact sheets that you were given: MRSA Information   Fleet enema the am of your surgery.

## 2011-10-28 NOTE — Telephone Encounter (Signed)
Pat called requesting clarification for extra purple top tube to send to pathology.  Doesn't know if it needs a special order and [facility] lab needs to draw this.  Unable to read the faxed order.  This nurse noted in office note patient needs a micro-satelite instability testing.  Cheryl Bible will call pathology.  Patient there now.  Will notify providers.

## 2011-10-29 ENCOUNTER — Ambulatory Visit: Payer: 59 | Admitting: Oncology

## 2011-10-30 ENCOUNTER — Ambulatory Visit (HOSPITAL_COMMUNITY): Payer: 59 | Admitting: Anesthesiology

## 2011-10-30 ENCOUNTER — Encounter (HOSPITAL_COMMUNITY)
Admission: RE | Disposition: A | Discharge status: Home / Self Care | Payer: Self-pay | Source: Ambulatory Visit | Attending: Surgery

## 2011-10-30 ENCOUNTER — Encounter (HOSPITAL_COMMUNITY): Payer: Self-pay | Admitting: *Deleted

## 2011-10-30 ENCOUNTER — Encounter (HOSPITAL_COMMUNITY): Payer: Self-pay | Admitting: Anesthesiology

## 2011-10-30 ENCOUNTER — Encounter (HOSPITAL_COMMUNITY): Payer: Self-pay | Admitting: Surgery

## 2011-10-30 ENCOUNTER — Other Ambulatory Visit (INDEPENDENT_AMBULATORY_CARE_PROVIDER_SITE_OTHER): Payer: Self-pay | Admitting: Surgery

## 2011-10-30 ENCOUNTER — Ambulatory Visit (HOSPITAL_COMMUNITY)
Admission: RE | Admit: 2011-10-30 | Discharge: 2011-10-31 | Disposition: A | Discharge status: Home / Self Care | Payer: 59 | Source: Ambulatory Visit | Attending: Surgery | Admitting: Surgery

## 2011-10-30 DIAGNOSIS — Z79899 Other long term (current) drug therapy: Secondary | ICD-10-CM | POA: Insufficient documentation

## 2011-10-30 DIAGNOSIS — C20 Malignant neoplasm of rectum: Secondary | ICD-10-CM

## 2011-10-30 DIAGNOSIS — Z01812 Encounter for preprocedural laboratory examination: Secondary | ICD-10-CM | POA: Insufficient documentation

## 2011-10-30 HISTORY — PX: PARTIAL PROCTECTOMY: SHX2176

## 2011-10-30 HISTORY — PX: PROCTOSCOPY: SHX2266

## 2011-10-30 LAB — CBC
HCT: 34.7 % — ABNORMAL LOW (ref 36.0–46.0)
Hemoglobin: 11.8 g/dL — ABNORMAL LOW (ref 12.0–15.0)
MCH: 29.1 pg (ref 26.0–34.0)
MCHC: 34 g/dL (ref 30.0–36.0)
MCV: 85.7 fL (ref 78.0–100.0)
Platelets: 254 10*3/uL (ref 150–400)
RBC: 4.05 MIL/uL (ref 3.87–5.11)
RDW: 12.8 % (ref 11.5–15.5)
WBC: 11.5 10*3/uL — ABNORMAL HIGH (ref 4.0–10.5)

## 2011-10-30 LAB — CREATININE, SERUM
Creatinine, Ser: 0.79 mg/dL (ref 0.50–1.10)
GFR calc Af Amer: >90 mL/min (ref >90)
GFR calc non Af Amer: >90 mL/min (ref >90)

## 2011-10-30 SURGERY — PARTIAL PROCTECTOMY BY TEM
Anesthesia: General | Site: Rectum | Wound class: Clean Contaminated

## 2011-10-30 MED ORDER — LACTATED RINGERS IV SOLN
INTRAVENOUS | Status: DC
  Administered 2011-10-30: 1000 mL via INTRAVENOUS

## 2011-10-30 MED ORDER — OXYCODONE HCL 5 MG PO TABS: 5.0000 mg | ORAL_TABLET | Freq: Four times a day (QID) | ORAL | Status: AC | PRN

## 2011-10-30 MED ORDER — SODIUM CHLORIDE 0.9 % IV SOLN
1.0000 g | INTRAVENOUS | Status: AC
  Administered 2011-10-30: 1 g via INTRAVENOUS

## 2011-10-30 MED ORDER — ADULT MULTIVITAMIN W/MINERALS CH
1.0000 | ORAL_TABLET | Freq: Every day | ORAL | Status: DC
  Filled 2011-10-30 (×2): qty 1

## 2011-10-30 MED ORDER — FENTANYL CITRATE 0.05 MG/ML IJ SOLN
25.0000 ug | INTRAMUSCULAR | Status: DC | PRN
  Administered 2011-10-30 (×2): 50 ug via INTRAVENOUS

## 2011-10-30 MED ORDER — FUROSEMIDE 40 MG PO TABS
40.0000 mg | ORAL_TABLET | Freq: Every day | ORAL | Status: DC
  Filled 2011-10-30: qty 1

## 2011-10-30 MED ORDER — FLORA-Q PO CAPS
1.0000 | ORAL_CAPSULE | Freq: Every day | ORAL | Status: DC
  Administered 2011-10-30: 1 via ORAL
  Filled 2011-10-30 (×2): qty 1

## 2011-10-30 MED ORDER — SODIUM CHLORIDE 0.9 % IV SOLN
INTRAVENOUS | Status: AC
  Filled 2011-10-30: qty 1

## 2011-10-30 MED ORDER — HYDROMORPHONE BOLUS VIA INFUSION
0.5000 mg | INTRAVENOUS | Status: DC | PRN
  Filled 2011-10-30: qty 2

## 2011-10-30 MED ORDER — MIDAZOLAM HCL 2 MG/2ML IJ SOLN
INTRAMUSCULAR | Status: AC
  Filled 2011-10-30: qty 2

## 2011-10-30 MED ORDER — FENTANYL CITRATE 0.05 MG/ML IJ SOLN
INTRAMUSCULAR | Status: AC
  Administered 2011-10-30: 50 ug via INTRAVENOUS
  Filled 2011-10-30: qty 2

## 2011-10-30 MED ORDER — LACTATED RINGERS IV SOLN: INTRAVENOUS | Status: DC

## 2011-10-30 MED ORDER — PROMETHAZINE HCL 25 MG/ML IJ SOLN: 12.5000 mg | Freq: Four times a day (QID) | INTRAMUSCULAR | Status: DC | PRN

## 2011-10-30 MED ORDER — FENTANYL CITRATE 0.05 MG/ML IJ SOLN
INTRAMUSCULAR | Status: DC | PRN
  Administered 2011-10-30 (×2): 50 ug via INTRAVENOUS
  Administered 2011-10-30: 100 ug via INTRAVENOUS
  Administered 2011-10-30: 50 ug via INTRAVENOUS

## 2011-10-30 MED ORDER — ROCURONIUM BROMIDE 100 MG/10ML IV SOLN
INTRAVENOUS | Status: DC | PRN
  Administered 2011-10-30 (×2): 10 mg via INTRAVENOUS
  Administered 2011-10-30: 35 mg via INTRAVENOUS
  Administered 2011-10-30: 10 mg via INTRAVENOUS

## 2011-10-30 MED ORDER — PSYLLIUM 95 % PO PACK
1.0000 | PACK | Freq: Two times a day (BID) | ORAL | Status: DC
  Administered 2011-10-30: 1 via ORAL
  Filled 2011-10-30 (×3): qty 1

## 2011-10-30 MED ORDER — EPINEPHRINE 0.15 MG/0.3ML IJ DEVI
0.1500 mg | INTRAMUSCULAR | Status: DC | PRN
  Filled 2011-10-30: qty 0.3

## 2011-10-30 MED ORDER — DIPHENHYDRAMINE HCL 50 MG/ML IJ SOLN: 12.5000 mg | Freq: Four times a day (QID) | INTRAMUSCULAR | Status: DC | PRN

## 2011-10-30 MED ORDER — ACETAMINOPHEN 325 MG PO TABS: 325.0000 mg | ORAL_TABLET | Freq: Four times a day (QID) | ORAL | Status: DC | PRN

## 2011-10-30 MED ORDER — VITAMIN D3 25 MCG (1000 UNIT) PO TABS
1000.0000 [IU] | ORAL_TABLET | Freq: Every day | ORAL | Status: DC
  Filled 2011-10-30 (×2): qty 1

## 2011-10-30 MED ORDER — PROMETHAZINE HCL 25 MG/ML IJ SOLN: 6.2500 mg | INTRAMUSCULAR | Status: DC | PRN

## 2011-10-30 MED ORDER — GLYCOPYRROLATE 0.2 MG/ML IJ SOLN
INTRAMUSCULAR | Status: DC | PRN
  Administered 2011-10-30: .3 mg via INTRAVENOUS

## 2011-10-30 MED ORDER — METOPROLOL TARTRATE 12.5 MG HALF TABLET
12.5000 mg | ORAL_TABLET | Freq: Two times a day (BID) | ORAL | Status: DC | PRN
  Filled 2011-10-30: qty 1

## 2011-10-30 MED ORDER — BUPIVACAINE-EPINEPHRINE PF 0.25-1:200000 % IJ SOLN
INTRAMUSCULAR | Status: AC
  Filled 2011-10-30: qty 30

## 2011-10-30 MED ORDER — PROPOFOL 10 MG/ML IV EMUL
INTRAVENOUS | Status: DC | PRN
  Administered 2011-10-30: 120 mg via INTRAVENOUS

## 2011-10-30 MED ORDER — LIDOCAINE HCL (CARDIAC) 20 MG/ML IV SOLN
INTRAVENOUS | Status: DC | PRN
  Administered 2011-10-30: 50 mg via INTRAVENOUS

## 2011-10-30 MED ORDER — POTASSIUM CHLORIDE ER 8 MEQ PO TBCR
8.0000 meq | EXTENDED_RELEASE_TABLET | Freq: Every day | ORAL | Status: DC
  Filled 2011-10-30 (×2): qty 1

## 2011-10-30 MED ORDER — ALUM & MAG HYDROXIDE-SIMETH 200-200-20 MG/5ML PO SUSP: 30.0000 mL | Freq: Four times a day (QID) | ORAL | Status: DC | PRN

## 2011-10-30 MED ORDER — DROPERIDOL 2.5 MG/ML IJ SOLN
INTRAMUSCULAR | Status: DC | PRN
  Administered 2011-10-30: 0.625 mg via INTRAVENOUS

## 2011-10-30 MED ORDER — HEPARIN SODIUM (PORCINE) 5000 UNIT/ML IJ SOLN
5000.0000 [IU] | Freq: Once | INTRAMUSCULAR | Status: AC
  Administered 2011-10-30: 5000 [IU] via SUBCUTANEOUS

## 2011-10-30 MED ORDER — IBUPROFEN 600 MG PO TABS
600.0000 mg | ORAL_TABLET | Freq: Four times a day (QID) | ORAL | Status: DC
  Administered 2011-10-31: 600 mg via ORAL
  Filled 2011-10-30 (×7): qty 1

## 2011-10-30 MED ORDER — DEXAMETHASONE SODIUM PHOSPHATE 10 MG/ML IJ SOLN
INTRAMUSCULAR | Status: DC | PRN
  Administered 2011-10-30: 10 mg via INTRAVENOUS

## 2011-10-30 MED ORDER — ONDANSETRON HCL 4 MG/2ML IJ SOLN: 4.0000 mg | Freq: Four times a day (QID) | INTRAMUSCULAR | Status: DC | PRN

## 2011-10-30 MED ORDER — LACTATED RINGERS IV SOLN
INTRAVENOUS | Status: DC
  Administered 2011-10-31: 75 mL via INTRAVENOUS

## 2011-10-30 MED ORDER — ONDANSETRON HCL 4 MG PO TABS: 4.0000 mg | ORAL_TABLET | Freq: Four times a day (QID) | ORAL | Status: DC | PRN

## 2011-10-30 MED ORDER — BUPIVACAINE-EPINEPHRINE 0.25% -1:200000 IJ SOLN
INTRAMUSCULAR | Status: DC | PRN
  Administered 2011-10-30: 20 mL

## 2011-10-30 MED ORDER — MAGIC MOUTHWASH
15.0000 mL | Freq: Four times a day (QID) | ORAL | Status: DC | PRN
  Filled 2011-10-30: qty 15

## 2011-10-30 MED ORDER — MIDAZOLAM HCL 2 MG/2ML IJ SOLN
1.0000 mg | INTRAMUSCULAR | Status: DC | PRN
  Administered 2011-10-30: 2 mg via INTRAVENOUS

## 2011-10-30 MED ORDER — HEPARIN SODIUM (PORCINE) 5000 UNIT/ML IJ SOLN
INTRAMUSCULAR | Status: AC
  Administered 2011-10-30: 5000 [IU] via SUBCUTANEOUS
  Filled 2011-10-30: qty 1

## 2011-10-30 MED ORDER — LACTATED RINGERS IV SOLN
INTRAVENOUS | Status: DC | PRN
  Administered 2011-10-30 (×3): via INTRAVENOUS

## 2011-10-30 MED ORDER — ONDANSETRON HCL 4 MG/2ML IJ SOLN
INTRAMUSCULAR | Status: DC | PRN
  Administered 2011-10-30: 4 mg via INTRAVENOUS

## 2011-10-30 MED ORDER — OMEGA-3-ACID ETHYL ESTERS 1 G PO CAPS
1.0000 g | ORAL_CAPSULE | Freq: Every day | ORAL | Status: DC
  Filled 2011-10-30 (×2): qty 1

## 2011-10-30 MED ORDER — NEOSTIGMINE METHYLSULFATE 1 MG/ML IJ SOLN
INTRAMUSCULAR | Status: DC | PRN
  Administered 2011-10-30: 2 mg via INTRAVENOUS

## 2011-10-30 MED ORDER — GUAIFENESIN-DM 100-10 MG/5ML PO SYRP: 15.0000 mL | ORAL_SOLUTION | ORAL | Status: DC | PRN

## 2011-10-30 MED ORDER — HEPARIN SODIUM (PORCINE) 5000 UNIT/ML IJ SOLN
5000.0000 [IU] | Freq: Three times a day (TID) | INTRAMUSCULAR | Status: DC
  Administered 2011-10-31: 5000 [IU] via SUBCUTANEOUS
  Filled 2011-10-30 (×4): qty 1

## 2011-10-30 MED ORDER — EPHEDRINE SULFATE 50 MG/ML IJ SOLN
INTRAMUSCULAR | Status: DC | PRN
  Administered 2011-10-30: 5 mg via INTRAVENOUS

## 2011-10-30 MED ORDER — OXYCODONE HCL 5 MG PO TABS
5.0000 mg | ORAL_TABLET | ORAL | Status: DC | PRN
  Administered 2011-10-30 (×2): 5 mg via ORAL
  Filled 2011-10-30 (×2): qty 1

## 2011-10-30 MED ORDER — CALCIUM CARBONATE 600 MG PO TABS
600.0000 mg | ORAL_TABLET | Freq: Two times a day (BID) | ORAL | Status: DC
  Filled 2011-10-30 (×3): qty 1

## 2011-10-30 SURGICAL SUPPLY — 69 items
BLADE SURG SZ10 CARB STEEL (BLADE) ×1 IMPLANT
BRIEF STRETCH FOR OB PAD LRG (UNDERPADS AND DIAPERS) ×1 IMPLANT
CATH FOLEY 3WAY 30CC 26FR (CATHETERS) IMPLANT
CATH FOLEY SILVER 30CC 26FR (CATHETERS) IMPLANT
CLIP SUT LAPRA TY ABSORB (SUTURE) ×2 IMPLANT
CLOTH BEACON ORANGE TIMEOUT ST (SAFETY) ×2 IMPLANT
COVER MAYO STAND STRL (DRAPES) ×1 IMPLANT
DEVICE SUT QUICK LOAD TK 5 (STAPLE) ×2 IMPLANT
DEVICE SUT TI-KNOT TK 5X26 (MISCELLANEOUS) ×1 IMPLANT
DRAPE C-ARM 42X72 X-RAY (DRAPES) ×1 IMPLANT
DRAPE CAMERA CLOSED 9X96 (DRAPES) ×2 IMPLANT
DRAPE LAPAROSCOPIC ABDOMINAL (DRAPES) ×1 IMPLANT
DRAPE LAPAROTOMY T 102X78X121 (DRAPES) ×1 IMPLANT
DRAPE LG THREE QUARTER DISP (DRAPES) ×1 IMPLANT
DRAPE WARM FLUID 44X44 (DRAPE) ×2 IMPLANT
ELECT BLADE 6.5 EXT (BLADE) IMPLANT
ELECT CAUTERY BLADE 6.4 (BLADE) ×1 IMPLANT
ELECT REM PT RETURN 9FT ADLT (ELECTROSURGICAL) ×2
ELECTRODE REM PT RTRN 9FT ADLT (ELECTROSURGICAL) ×1 IMPLANT
GAUZE SPONGE 4X4 12PLY STRL LF (GAUZE/BANDAGES/DRESSINGS) ×1 IMPLANT
GAUZE SPONGE 4X4 16PLY XRAY LF (GAUZE/BANDAGES/DRESSINGS) IMPLANT
GLOVE ECLIPSE 8.0 STRL XLNG CF (GLOVE) ×3 IMPLANT
GLOVE INDICATOR 8.0 STRL GRN (GLOVE) ×4 IMPLANT
GOWN STRL NON-REIN LRG LVL3 (GOWN DISPOSABLE) ×4 IMPLANT
GOWN STRL REIN XL XLG (GOWN DISPOSABLE) ×4 IMPLANT
HEMOSTAT SURGICEL 4X8 (HEMOSTASIS) IMPLANT
HOOK RETRACTION 12 ELAST STAY (MISCELLANEOUS) IMPLANT
IV LACTATED RINGERS 1000ML (IV SOLUTION) ×1 IMPLANT
KIT BASIN OR (CUSTOM PROCEDURE TRAY) ×2 IMPLANT
LEGGING LITHOTOMY PAIR STRL (DRAPES) ×1 IMPLANT
LUBRICANT JELLY K Y 4OZ (MISCELLANEOUS) ×3 IMPLANT
NDL INSUFFLATION 14GA 120MM (NEEDLE) IMPLANT
NEEDLE HYPO 22GX1.5 SAFETY (NEEDLE) ×1 IMPLANT
NEEDLE INSUFFLATION 14GA 120MM (NEEDLE) ×2 IMPLANT
NS IRRIG 1000ML POUR BTL (IV SOLUTION) ×2 IMPLANT
PACK GENERAL/GYN (CUSTOM PROCEDURE TRAY) ×1 IMPLANT
PENCIL BUTTON HOLSTER BLD 10FT (ELECTRODE) ×1 IMPLANT
RETRACTOR STAY HOOK 5MM (MISCELLANEOUS) IMPLANT
RETRACTOR WILSON SYSTEM (INSTRUMENTS) IMPLANT
SCALPEL HARMONIC ACE (MISCELLANEOUS) ×1 IMPLANT
SCISSORS LAP 5X35 DISP (ENDOMECHANICALS) ×1 IMPLANT
SPONGE GAUZE 4X4 12PLY (GAUZE/BANDAGES/DRESSINGS) ×1 IMPLANT
SPONGE HEMORRHOID 8X3CM (HEMOSTASIS) IMPLANT
SPONGE LAP 18X18 X RAY DECT (DISPOSABLE) IMPLANT
SPONGE SURGIFOAM ABS GEL 100 (HEMOSTASIS) IMPLANT
STOPCOCK K 69 2C6206 (IV SETS) ×2 IMPLANT
SUCTION POOLE TIP (SUCTIONS) IMPLANT
SUT PDS AB 2-0 CT2 27 (SUTURE) ×8 IMPLANT
SUT PDS AB 3-0 SH 27 (SUTURE) ×4 IMPLANT
SUT SILK 2 0 (SUTURE) ×2
SUT SILK 2 0 SH CR/8 (SUTURE) IMPLANT
SUT SILK 2-0 18XBRD TIE 12 (SUTURE) IMPLANT
SUT SILK 3 0 SH 30 (SUTURE) IMPLANT
SUT SILK 3 0 SH CR/8 (SUTURE) IMPLANT
SUT VIC AB 2-0 UR6 27 (SUTURE) IMPLANT
SUT VIC AB 3-0 SH 18 (SUTURE) ×1 IMPLANT
SUT VIC AB 3-0 SH 27 (SUTURE)
SUT VIC AB 3-0 SH 27XBRD (SUTURE) IMPLANT
SUT VIC AB 4-0 SH 18 (SUTURE) IMPLANT
SYR 20CC LL (SYRINGE) IMPLANT
SYR 30ML LL (SYRINGE) ×1 IMPLANT
SYR BULB IRRIGATION 50ML (SYRINGE) IMPLANT
SYRINGE IRR TOOMEY STRL 70CC (SYRINGE) IMPLANT
TAPE CLOTH SURG 4X10 WHT LF (GAUZE/BANDAGES/DRESSINGS) ×1 IMPLANT
TOWEL OR 17X26 10 PK STRL BLUE (TOWEL DISPOSABLE) ×2 IMPLANT
TRAY FOLEY CATH 14FRSI W/METER (CATHETERS) ×2 IMPLANT
TUBING CONNECTING 10 (TUBING) ×1 IMPLANT
YANKAUER SUCT BULB TIP 10FT TU (MISCELLANEOUS) ×1 IMPLANT
YANKAUER SUCT BULB TIP NO VENT (SUCTIONS) IMPLANT

## 2011-10-30 NOTE — Interval H&P Note (Signed)
History and Physical Interval Note:  10/30/2011 12:00 PM  Cheryl Holland  has presented today for surgery, with the diagnosis of rectal polyp/rectal cancer  The various methods of treatment have been discussed with the patient and family. After consideration of risks, benefits and other options for treatment, the patient has consented to  Procedure(s) (LRB): PARTIAL PROCTECTOMY BY TEM (N/A) as a surgical intervention .  The patients' history has been reviewed, patient examined, no change in status, stable for surgery.  I have reviewed the patients' chart and labs.  Questions were answered to the patient & husband's satisfaction.     [name] C.

## 2011-10-30 NOTE — Op Note (Signed)
NAME:  Cheryl Holland, Cheryl Holland                 ACCOUNT NO.:  [id number]  MEDICAL RECORD NO.:  [id number]  LOCATION:  1539                         FACILITY:  [facility]  PHYSICIAN:  Ardeth Sportsman, MD     DATE OF BIRTH:  [birth year]  DATE OF PROCEDURE:  10/30/2011 DATE OF DISCHARGE:                              OPERATIVE REPORT   PRIMARY CARE PHYSICIAN:  Geoffry Paradise, M.D.  GASTROENTEROLOGISTS:  Dr. Yancey Flemings and Dr. Rob Bunting, [facility].  SURGEON:  Ardeth Sportsman, MD  ASSIST:  RN.  PREOPERATIVE DIAGNOSES:  Rectal cancer found within a proximal rectal polyp, status post polypectomy, uT0-1uN0.  POSTOPERATIVE DIAGNOSES:  Rectal cancer found within a proximal rectal polyp, status post polypectomy, uT0-1uN0.  PROCEDURES PERFORMED: 1. Rigid proctoscopy. 2. Partial proctectomy by transanal endoscopic microsurgical     technique.  ANESTHESIA: 1. General anesthesia. 2. Bilateral anorectal block.  SPECIMENS: Proximal rectal wall. -Proximal margin = lavender pins. -Distal margin = royal blue pins. -Left lateral margin = teal / light blue pins. -Right lateral margin = yellow pins.  Of note, there is a new right lateral margin that is included within the specimen.  ESTIMATED BLOOD LOSS:  Minimal.  DRAINS:  None.  COMPLICATIONS:  None apparent.  INDICATIONS FOR PROCEDURE:  Cheryl Holland is a healthy 51 year old female. She underwent screening colonoscopy by Dr. Marina Goodell .  She was found to have a moderate-sized polyp in the proximal rectum, about 10 cm from the anal verge. Cancer was noted within it.  Dr. Marina Goodell sent the patient for an endoscopic ultrasound.  Dr. Christella Hartigan did an ultrasound of the wound/ base that remained found at most T1 but probable T0 tumor remaining.  The patient was sent to me for surgical evaluation.  Given the fact that this was very Colla at the most, but the fact that cancer was close to the cautery margin of the polyp, I recommended more definitive  excisional biopsy by full-thickness excision.  Using TEM for partial proctectomy was discussed. Possible need for low anterior resection and further surgery depending on staging was discussed.  Technique, risks, benefits, and alternatives were discussed.  Discussion was made with her husband, Dr. Tawanna Cooler Roehm. They agreed to proceed.  OPERATIVE FINDINGS:  She had a stellate-shaped scar in the right posterior lateral proximal rectum.  It was about 12 cm from the anal verge by rigid proctoscopy.  Orientation of the polyp as noted above.  DESCRIPTION OF PROCEDURE:  Informed consent was confirmed.  The patient underwent general anesthesia without any difficulty.  She had a preoperative rectal prep.  She received IV ertapenem or Invanz for prophylaxis.  She was intubated without difficulty.  She was positioned, right side decubitus down.  I performed rigid proctoscopy to confirm orientation.  It seemed at about the 5 o'clock position, mostly posterior, but slightly right posterior.  Therefore, we positioned the patient in high lithotomy.  Her perineum was prepped and draped in sterile fashion.  Surgical time-out confirmed our plan.  I did gentle finger dilatation to place the 4 cm TEO Storz proctoscope in.  I advanced until I could find the tattoo markings and the stellate- shaped scar,  consistent with the mass over the remaining scar of the polypectomy site.  I marked 1.5 cm margins around the mass circumferentially using needle-tip point cautery.  I then began to transect through the rectal wall on the proximal side.  I came full- thickness all the way down and got into mesorectum.  I came proximally around the right lateral and left lateral margins.  I then transitioned over to ultrasonic dissection.  I resected mesorectum deep to the polypectomy site.  I came around more proximally.  I then was able to come around and transect the mucosa proximal to the mass.  This allowed [place]  some mesorectum as well.  I removed the specimen, placed it on the corkboard and pinned it as noted.  The right lateral margin seemed a little close, maybe slightly less than 1 cm.  I therefore went back and took what I felt was about a 7 mm extra margin, starting from the proximal to the distal end and taken onto the right lateral wall.  I think that it is appropriate.  I walked down to Pathology and showed it to the Pathology Team for proper orientation.  I returned and inspected the wound.  Hemostasis was excellent.  I could see a breach into the peritoneal cavity, in the right posterolateral wall of cul-de-sac.  There was a loop of small intestine coming down into the wound.  I positioned the patient more Trendelenburg and was able to get the small bowel away.  I closed that peritoneal defect using 2-0 Vicryl stitch, using an Endostitch in a running pursestring type fashion.  I closed that using a tie knot.  I then reapproximated the rectal wound.  The wound involved about 70% of the circumference.  I approximated using 2-0 Vicryl horizontal mattress stitches x2, dividing the closure into thirds.  I then closed using running 2-0 Vicryl stitches using Lapra-Ty's at both ends, and therefore had closed the wound using 3 short running sutures.  The lumen was narrowed, but I could easily pass both graspers and I would estimate it to be about 2.5 to 3 cm wide.  I meticulously inspected with fine tip-point graspers and saw closure was excellent circumferentially.  Hemostasis was good.  There was no evidence of peritoneal breach anymore.  I evacuated carbon dioxide up and towards the sigmoid using the long sucker tip and decompressed.  In palpating the abdomen, it was distended.  I therefore, sterilely prepped the periumbilical region.  I placed a towel clamp to hold the umbilical stalk.  I made a small opening in through the superior part of the umbilicus and passed a Veress needle  very easily on the first pass. We immediately released moderate volume of carbon dioxide until the abdomen was soft and flat.  The patient has been extubated and is now in recovery room.  I called her husband, Dr. Tawanna Cooler Studley on his cellphone per his request. I explained operative findings.  I had already discussed postoperative goals with the patient and her husband in the holding area.     Ardeth Sportsman, MD     SCG/MEDQ  D:  10/30/2011  T:  10/30/2011  Job:  [id number]  cc:   Geoffry Paradise, M.D. Fax: [phone]  Rachael Fee, MD [address] [place], [place] [address]  Wilhemina Bonito. Marina Goodell, MD [address] [place] [address]  Larina Earthly, M.D. [address] [place] [address]

## 2011-10-30 NOTE — Progress Notes (Signed)
1040 fleets enema taken per order good resuts

## 2011-10-30 NOTE — Anesthesia Postprocedure Evaluation (Signed)
Anesthesia Post Note  Patient: Cheryl Holland  Procedure(s) Performed: Procedure(s) (LRB): PARTIAL PROCTECTOMY BY TEM (N/A) PROCTOSCOPY (N/A)  Anesthesia type: General  Patient location: PACU  Post pain: Pain level controlled  Post assessment: Post-op Vital signs reviewed  Last Vitals:  Filed Vitals:   10/30/11 1545  BP: 116/69  Pulse: 66  Temp:   Resp: 15    Post vital signs: Reviewed  Level of consciousness: sedated  Complications: No apparent anesthesia complications

## 2011-10-30 NOTE — H&P (View-Only) (Signed)
Subjective:     Patient ID: Cheryl Holland, female   DOB: 07/01/1961, 51 y.o.   MRN: [id number]  HPI  Cheryl Holland  06/05/1961 [id number]  Patient Care Team: Richard A Aronson, MD as PCP - General (Internal Medicine) John Perry, MD as Consulting Physician (Gastroenterology) Daniel Jacobs, MD as Consulting Physician (Gastroenterology)  This patient is a 51 y.o.female who presents today for surgical evaluation at the request of Drs. Perry & Jacobs.   She is a pleasant, physically active female. She underwent screening colonoscopy.  Dr. Perry found a 4 cm polyp in her proximal/mid rectum, ~10cm. Just at the level of the most distal rectal fold. It was excised. There was a high-grade dysplasia within it and a focus of adenocarcinoma as well.  She was sent for endoscopic ultrasound.   Dr. Jacobs found an ulcer at 8 cm from the anal verge. No deeper cancer obviously seen. Area was tattooed. Based on concerns, she was sent to me for evaluation.   She has a bowel movement every day. Quite active. Had an open inguinal hernia repair on the right side. No other surgeries.  She comes today with her husband, Dr. Todd Shawgo, with vascular surgery.  Patient Active Problem List  Diagnoses  . Rectal cancer in mid-rectal polyp s/p polypectomy    Past Medical History  Diagnosis Date  . Allergy     shimp  . Cataract     colon  . Rectal cancer in mid-rectal polyp s/p polypectomy 10/18/2011    Past Surgical History  Procedure Date  . Foot ganglion excision 2004 - approximate    right  . Inguinal hernia repair 2001    right  . Inguinal hernia repair     History   Social History  . Marital Status: Married    Spouse Name: N/A    Number of Children: N/A  . Years of Education: N/A   Occupational History  . Not on file.   Social History Main Topics  . Smoking status: Never Smoker   . Smokeless tobacco: Never Used  . Alcohol Use: 3.0 oz/week    5 Glasses of wine per week     glass of wine 2  or 3 times per week  . Drug Use: No  . Sexually Active: Not on file   Other Topics Concern  . Not on file   Social History Narrative   Spouse is Dr. Todd Nishiyama, vascular surgeon    Family History  Problem Relation Age of Onset  . Colon cancer Neg Hx   . Dementia Father     Current outpatient prescriptions:calcium carbonate (OS-CAL) 600 MG TABS, Take 600 mg by mouth 2 (two) times daily with a meal., Disp: , Rfl: ;  cholecalciferol (VITAMIN D) 1000 UNITS tablet, Take 1,000 Units by mouth daily., Disp: , Rfl: ;  EPINEPHrine (EPIPEN JR) 0.15 MG/0.3ML injection, Inject 0.15 mg into the muscle as needed., Disp: , Rfl: ;  fish oil-omega-3 fatty acids 1000 MG capsule, Take 1 g by mouth daily., Disp: , Rfl:  furosemide (LASIX) 40 MG tablet, Take 40 mg by mouth daily., Disp: , Rfl: ;  Multiple Vitamins-Minerals (MULTIVITAMIN WITH MINERALS) tablet, Take 1 tablet by mouth daily., Disp: , Rfl: ;  potassium chloride (KLOR-CON) 8 MEQ tablet, Take 8 mEq by mouth daily., Disp: , Rfl:   Allergies  Allergen Reactions  . Shrimp (Shellfish Allergy) Anaphylaxis, Hives and Swelling    BP 116/78  Pulse 66  Temp(Src) 97.9 F (  36.6 C) (Temporal)  Resp 16  Ht 5' 2.5" (1.588 m)  Wt 125 lb 6.4 oz (56.881 kg)  BMI 22.57 kg/m2     Review of Systems  Constitutional: Negative for fever, chills, diaphoresis, appetite change and fatigue.  HENT: Negative for ear pain, sore throat, trouble swallowing, neck pain and ear discharge.   Eyes: Negative for photophobia, discharge and visual disturbance.  Respiratory: Negative for cough, choking, chest tightness and shortness of breath.   Cardiovascular: Negative for chest pain and palpitations.       Runs 5-8 miles regularly  Gastrointestinal: Negative for nausea, vomiting, abdominal pain, diarrhea, constipation, anal bleeding and rectal pain.       BM daily  Genitourinary: Negative for dysuria, frequency and difficulty urinating.  Musculoskeletal: Negative for  myalgias and gait problem.  Skin: Negative for color change, pallor and rash.       No h/o MRSA / skin infections  Neurological: Negative for dizziness, speech difficulty, weakness and numbness.  Hematological: Negative for adenopathy.  Psychiatric/Behavioral: Negative for confusion and agitation. The patient is not nervous/anxious.        Objective:   Physical Exam  Constitutional: She is oriented to person, place, and time. She appears well-developed and well-nourished. No distress.  HENT:  Head: Normocephalic.  Mouth/Throat: Oropharynx is clear and moist. No oropharyngeal exudate.  Eyes: Conjunctivae and EOM are normal. Pupils are equal, round, and reactive to light. No scleral icterus.  Neck: Normal range of motion. Neck supple. No tracheal deviation present.  Cardiovascular: Normal rate, regular rhythm and intact distal pulses.   Pulmonary/Chest: Effort normal and breath sounds normal. No respiratory distress. She exhibits no tenderness.  Abdominal: Soft. She exhibits no distension and no mass. There is no tenderness. Hernia confirmed negative in the right inguinal area and confirmed negative in the left inguinal area.  Genitourinary: No vaginal discharge found.       Perianal skin clean with good hygiene.  No pruritis.  No pilonidal disease.  No fissure.  No abscess/fistula.    Tolerates digital rectal exam.  Normal sphincter tone.   No rectal masses.  Hemorrhoidal piles WNL  R lat wall ?ulcer felt ~8cm from anal verge   Musculoskeletal: Normal range of motion. She exhibits no tenderness.  Lymphadenopathy:    She has no cervical adenopathy.       Right: No inguinal adenopathy present.       Left: No inguinal adenopathy present.  Neurological: She is alert and oriented to person, place, and time. No cranial nerve deficit. She exhibits normal muscle tone. Coordination normal.  Skin: Skin is warm and dry. No rash noted. She is not diaphoretic. No erythema.  Psychiatric: She has  a normal mood and affect. Her speech is normal and behavior is normal. Judgment and thought content normal. Her mood appears not anxious. She does not exhibit a depressed mood.       Pleasant, smiling       Assessment:     AdenoCA & HG dysplasia within mid-rectal polyp s/p polypectomy.      Plan:     Given the fact that the polyp had cancer within it & the margin was close, I think she needs more definitive excisional biopsy resection for better margins. I think she is a good candidate for TEM to do that. This would avoid strating with a low anterior resection with a possible diverting loop ileostomy. Did have a long discussion with she and her husband, Dr. Todd Rayman.    The anatomy & physiology of the digestive tract was discussed.  The pathophysiology of the rectal pathology was discussed.  Natural history risks without surgery was discussed.   I feel the risks of no intervention will lead to serious problems that outweigh the operative risks; therefore, I recommended surgery.    Laparoscopic & open abdominal techniques were discussed.  I recommended we start with a partial proctectomy by transanal endoscopic microsurgery (TEM) for excisional biopsy to remove the pathology and hopefully cure and/or control the pathology.  This technique can offer less operative risk and faster post-operative recovery.  Possible need for immediate or later abdominal surgery for further treatment was discussed.   Risks such as bleeding, abscess, reoperation, heart attack, death, and other risks were discussed.   I noted a good likelihood this will help address the problem.  Goals of post-operative recovery were discussed as well.  We will work to minimize complications.  An educational handout was given as well.  Questions were answered.  The patient expresses understanding & wishes to proceed with surgery.        

## 2011-10-30 NOTE — Anesthesia Preprocedure Evaluation (Addendum)
Anesthesia Evaluation  Patient identified by MRN, date of birth, ID band Patient awake    Reviewed: Allergy & Precautions, H&P , NPO status , Patient's Chart, lab work & pertinent test results  Airway Mallampati: II TM Distance: >3 FB Neck ROM: full    Dental No notable dental hx. (+) Teeth Intact and Dental Advidsory Given   Pulmonary neg pulmonary ROS,  clear to auscultation  Pulmonary exam normal       Cardiovascular Exercise Tolerance: Good neg cardio ROS regular Normal    Neuro/Psych Negative Neurological ROS  Negative Psych ROS   GI/Hepatic negative GI ROS, Neg liver ROS,   Endo/Other  Negative Endocrine ROS  Renal/GU negative Renal ROS  Genitourinary negative   Musculoskeletal   Abdominal Normal abdominal exam  (+)   Peds  Hematology negative hematology ROS (+)   Anesthesia Other Findings   Reproductive/Obstetrics negative OB ROS                           Anesthesia Physical Anesthesia Plan  ASA: I  Anesthesia Plan: General   Post-op Pain Management:    Induction:   Airway Management Planned:   Additional Equipment:   Intra-op Plan:   Post-operative Plan:   Informed Consent: I have reviewed the patients History and Physical, chart, labs and discussed the procedure including the risks, benefits and alternatives for the proposed anesthesia with the patient or authorized representative who has indicated his/her understanding and acceptance.   Dental Advisory Given  Plan Discussed with: CRNA  Anesthesia Plan Comments:         Anesthesia Quick Evaluation

## 2011-10-30 NOTE — Brief Op Note (Signed)
10/30/2011  2:41 PM  PATIENT:  Cheryl Holland  51 y.o. female  Patient Care Team: Minda Meo, MD as PCP - General (Internal Medicine) Yancey Flemings, MD as Consulting Physician (Gastroenterology) Rob Bunting, MD as Consulting Physician (Gastroenterology)  PRE-OPERATIVE DIAGNOSIS:  Rectal cancer in a rectal polyp s/p polypectomy  POST-OPERATIVE DIAGNOSIS:  Rectal cancer in a rectal polyp s/p polypectomy  PROCEDURE:  Procedure(s) (LRB): PARTIAL PROCTECTOMY BY TEM (N/A) PROCTOSCOPY (N/A)  SURGEON:  Surgeon(s) and Role:    * Ardeth Sportsman, MD - Primary  PHYSICIAN ASSISTANT:   ASSISTANTS: none   ANESTHESIA:   local and general  EBL:  Total I/O In: 2000 [I.V.:2000] Out: -   BLOOD ADMINISTERED:none  DRAINS: none   LOCAL MEDICATIONS USED:  BUPIVICAINE   SPECIMEN:  Source of Specimen:  Scar of polypectomy within rectal wall   DISPOSITION OF SPECIMEN:  PATHOLOGY  COUNTS:  YES  TOURNIQUET:  * No tourniquets in log *  DICTATION: .Other Dictation: Dictation Number [id number]  PLAN OF CARE: Admit for overnight observation  PATIENT DISPOSITION:  PACU - hemodynamically stable.   Delay start of Pharmacological VTE agent (>24hrs) due to surgical blood loss or risk of bleeding: no  I discussed the patient's status to Dr. Arbie Cookey, her husband via his cellphone per his wish.  Questions were answered.  He expressed understanding & appreciation.

## 2011-10-30 NOTE — Preoperative (Signed)
Beta Blockers   Reason not to administer Beta Blockers:Not Applicable 

## 2011-10-30 NOTE — Transfer of Care (Signed)
Immediate Anesthesia Transfer of Care Note  Patient: Cheryl Holland  Procedure(s) Performed: Procedure(s) (LRB): PARTIAL PROCTECTOMY BY TEM (N/A) PROCTOSCOPY (N/A)  Patient Location: PACU  Anesthesia Type: General  Level of Consciousness: awake, alert , oriented, patient cooperative and responds to stimulation  Airway & Oxygen Therapy: Patient Spontanous Breathing and Patient connected to face mask oxygen  Post-op Assessment: Report given to PACU RN, Post -op Vital signs reviewed and stable and Patient moving all extremities  Post vital signs: Reviewed and stable  Complications: No apparent anesthesia complications

## 2011-10-31 LAB — BASIC METABOLIC PANEL
BUN: 10 mg/dL (ref 6–23)
CO2: 28 mEq/L (ref 19–32)
Calcium: 9.3 mg/dL (ref 8.4–10.5)
Chloride: 104 mEq/L (ref 96–112)
Creatinine, Ser: 0.73 mg/dL (ref 0.50–1.10)
GFR calc Af Amer: >90 mL/min (ref >90)
GFR calc non Af Amer: >90 mL/min (ref >90)
Glucose, Bld: 114 mg/dL — ABNORMAL HIGH (ref 70–99)
Potassium: 3.8 mEq/L (ref 3.5–5.1)
Sodium: 141 mEq/L (ref 135–145)

## 2011-10-31 LAB — CBC
HCT: 34.1 % — ABNORMAL LOW (ref 36.0–46.0)
Hemoglobin: 11.7 g/dL — ABNORMAL LOW (ref 12.0–15.0)
MCH: 29.5 pg (ref 26.0–34.0)
MCHC: 34.3 g/dL (ref 30.0–36.0)
MCV: 86.1 fL (ref 78.0–100.0)
Platelets: 250 10*3/uL (ref 150–400)
RBC: 3.96 MIL/uL (ref 3.87–5.11)
RDW: 12.8 % (ref 11.5–15.5)
WBC: 10.9 10*3/uL — ABNORMAL HIGH (ref 4.0–10.5)

## 2011-10-31 MED ORDER — SODIUM CHLORIDE 0.9 % IJ SOLN: 3.0000 mL | Freq: Two times a day (BID) | INTRAMUSCULAR | Status: DC

## 2011-10-31 MED ORDER — SODIUM CHLORIDE 0.9 % IJ SOLN: 3.0000 mL | INTRAMUSCULAR | Status: DC | PRN

## 2011-10-31 NOTE — Discharge Summary (Signed)
Physician Discharge Summary  Patient ID: Cheryl Holland MRN: [id number] DOB/AGE: [birth year] 51 y.o.  Admit date: 10/30/2011 Discharge date: 10/31/2011  Patient Care Team: Minda Meo, MD as PCP - General (Internal Medicine) Yancey Flemings, MD as Consulting Physician (Gastroenterology) Rob Bunting, MD as Consulting Physician (Gastroenterology)  Admission Diagnoses: Principal Problem:  *Rectal cancer in proximal rectal polyp s/p polypectomy  Discharge Diagnoses:  Principal Problem:  *Rectal cancer in proximal rectal polyp s/p polypectomy   Discharged Condition: good  Hospital Course: Pt underwent TEM surgery.  She has some RUQ & shoulder discomfort but that rapidly resolved.  By POD#1, she was walking well in hallways, tolerating PO, urinating w/o difficulty, +Flatus & small BM.  Pain minimal & easily controlled.  Therefore, I felt it was safe to go home w close followup.  Instructions d/w pt & her husband.  Card given.  F/u path next week  Consults: None  Significant Diagnostic Studies:   Treatments: surgery: TEM partial proctectomy  Discharge Exam: Blood pressure 102/60, pulse 74, temperature 98.4 F (36.9 C), temperature source Oral, resp. rate 16, height 5' 2.5" (1.588 m), weight 124 lb (56.246 kg), SpO2 100.00%.  General: Pt awake/alert/oriented x4 in no major acute distress Eyes: PERRL, normal EOM. Neuro: CN II-XII intact w/o focal sensory/motor deficits. Lymph: No head/neck/groin lymphadenopathy Psych:  No delerium/psychosis/paranoia.  Alert, smiling sitting up in bed HEENT: Normocephalic, Mucus membranes moist.  No thrush Neck: Supple, No tracheal deviation Chest: No pain w good excursion CV:  Pulses intact.  Regular rhythm Abdomen: soft, Nontender/nondistended.  No incarcerated hernias. Ext:  SCDs BLE.  No mjr edema.  No cyanosis Skin: No petechiae / purpurae   Disposition: Home or Self Care  Discharge Orders    Future Appointments: Provider: Department:  Dept Phone: Center:   11/26/2011 4:00 PM Lucile Shutters, MD [facility] [phone] None     Future Orders Please Complete By Expires   Diet - low sodium heart healthy      Increase activity slowly        Medication List  As of 10/31/2011  7:45 AM   TAKE these medications         calcium carbonate 600 MG Tabs   Commonly known as: OS-CAL   Take 600 mg by mouth 2 (two) times daily with a meal.      cholecalciferol 1000 UNITS tablet   Commonly known as: VITAMIN D   Take 1,000 Units by mouth daily.      EPINEPHrine 0.15 MG/0.3ML injection   Commonly known as: EPIPEN JR   Inject 0.15 mg into the muscle as needed.      fish oil-omega-3 fatty acids 1000 MG capsule   Take 1 g by mouth daily.      furosemide 40 MG tablet   Commonly known as: LASIX   Take 40 mg by mouth daily before breakfast.      ibuprofen 200 MG tablet   Commonly known as: ADVIL,MOTRIN   Take 600 mg by mouth every 6 (six) hours as needed. Pain      multivitamin with minerals tablet   Take 1 tablet by mouth daily.      oxyCODONE 5 MG immediate release tablet   Commonly known as: Oxy IR/ROXICODONE   Take 1-2 tablets (5-10 mg total) by mouth every 6 (six) hours as needed for pain.      potassium chloride 8 MEQ tablet   Commonly known as: KLOR-CON   Take 8 mEq by mouth  daily.           Follow-up Information    Follow up with Elmon Shader C., MD in 10 days.   Contact information:   [employer] [address] [place] [address] [phone]          Signed: Ardeth Sportsman. 10/31/2011, 7:45 AM

## 2011-10-31 NOTE — Discharge Instructions (Signed)
ANORECTAL SURGERY: POST OP INSTRUCTIONS  1. Take your usually prescribed home medications unless otherwise directed. 2. DIET: Follow a light bland diet the first 24 hours after arrival home, such as soup, liquids, crackers, etc.  Be sure to include lots of fluids daily.  Avoid fast food or heavy meals as your are more likely to get nauseated.  Eat a low fat the next few days after surgery.   3. PAIN CONTROL: a. Pain is best controlled by a usual combination of three different methods TOGETHER: i. Ice/Heat ii. Over the counter pain medication iii. Prescription pain medication b. Most patients will experience some swelling and discomfort in the anus/rectal area. and incisions.  Ice packs or heat (30-60 minutes up to 6 times a day) will help. Use ice for the first few days to help decrease swelling and bruising, then switch to heat such as warm towels, sitz baths, warm baths, etc to help relax tight/sore spots and speed recovery.  Some people prefer to use ice alone, heat alone, alternating between ice & heat.  Experiment to what works for you.  Swelling and bruising can take several weeks to resolve.   c. It is helpful to take an over-the-counter pain medication regularly for the first few weeks.  Choose one of the following that works best for you: i. Naproxen (Aleve, etc)  Two 220mg tabs twice a day ii. Ibuprofen (Advil, etc) Three 200mg tabs four times a day (every meal & bedtime) iii. Acetaminophen (Tylenol, etc) 500-650mg four times a day (every meal & bedtime) d. A  prescription for pain medication (such as oxycodone, hydrocodone, etc) should be given to you upon discharge.  Take your pain medication as prescribed.  i. If you are having problems/concerns with the prescription medicine (does not control pain, nausea, vomiting, rash, itching, etc), please call us [phone] to see if we need to switch you to a different pain medicine that will work better for you and/or control your side effect  better. ii. If you need a refill on your pain medication, please contact your pharmacy.  They will contact our office to request authorization. Prescriptions will not be filled after 5 pm or on week-ends. 4. KEEP YOUR BOWELS REGULAR a. The goal is one bowel movement a day b. Avoid getting constipated.  Between the surgery and the pain medications, it is common to experience some constipation.  Increasing fluid intake and taking a fiber supplement (such as Metamucil, Citrucel, FiberCon, MiraLax, etc) 1-2 times a day regularly will usually help prevent this problem from occurring.  A mild laxative (prune juice, Milk of Magnesia, MiraLax, etc) should be taken according to package directions if there are no bowel movements after 48 hours. c. Watch out for diarrhea.  If you have many loose bowel movements, simplify your diet to bland foods & liquids for a few days.  Stop any stool softeners and decrease your fiber supplement.  Switching to mild anti-diarrheal medications (Kayopectate, Pepto Bismol) can help.  If this worsens or does not improve, please call us.  5. Wound Care a. Remove your bandages the day after surgery.  Unless discharge instructions indicate otherwise, leave your bandage dry and in place overnight.  Remove the bandage during your first bowel movement.   b. Allow the wound packing to fall out over the next few days.  You can trim exposed gauze / ribbon as it falls out.  You do not need to repack the wound unless instructed otherwise.  Wear an   absorbent pad or soft cotton gauze in your underwear as needed to catch any drainage and help keep the area  c. Keep the area clean and dry.  Bathe / shower every day.  Keep the area clean by showering / bathing over the incision / wound.   It is okay to soak an open wound to help wash it.  Wet wipes or showers / gentle washing after bowel movements is often less traumatic than regular toilet paper. d. Bonita Quin may have some styrofoam-like soft packing in  the rectum which will come out with the first bowel movement.  e. You will often notice bleeding with bowel movements.  This should slow down by the end of the first week of surgery f. Expect some drainage.  This should slow down, too, by the end of the first week of surgery.  Wear an absorbent pad or soft cotton gauze in your underwear until the drainage stops. 6. ACTIVITIES as tolerated:   a. You may resume regular (light) daily activities beginning the next day--such as daily self-care, walking, climbing stairs--gradually increasing activities as tolerated.  If you can walk 30 minutes without difficulty, it is safe to try more intense activity such as jogging, treadmill, bicycling, low-impact aerobics, swimming, etc. b. Save the most intensive and strenuous activity for last such as sit-ups, heavy lifting, contact sports, etc  Refrain from any heavy lifting or straining until you are off narcotics for pain control.   c. DO NOT PUSH THROUGH PAIN.  Let pain be your guide: If it hurts to do something, don't do it.  Pain is your body warning you to avoid that activity for another week until the pain goes down. d. You may drive when you are no longer taking prescription pain medication, you can comfortably sit for long periods of time, and you can safely maneuver your car and apply brakes. e. Bonita Quin may have sexual intercourse when it is comfortable.  7. FOLLOW UP in our office a. Please call [facility] at [phone] to set up an appointment to see your surgeon in the office for a follow-up appointment approximately 2 weeks after your surgery. b. Make sure that you call for this appointment the day you arrive home to insure a convenient appointment time. 10. IF YOU HAVE DISABILITY OR FAMILY LEAVE FORMS, BRING THEM TO THE OFFICE FOR PROCESSING.  DO NOT GIVE THEM TO YOUR DOCTOR.        WHEN TO CALL us [phone]: 1. Poor pain control 2. Reactions / problems with new medications (rash/itching, nausea,  etc)  3. Fever over 101.5 F (38.5 C) 4. Inability to urinate 5. Nausea and/or vomiting 6. Worsening swelling or bruising 7. Continued bleeding from incision. 8. Increased pain, redness, or drainage from the incision  The clinic staff is available to answer your questions during regular business hours (8:30am-5pm).  Please don't hesitate to call and ask to speak to one of our nurses for clinical concerns.   A surgeon from [facility] is always on call at the hospitals   If you have a medical emergency, go to the nearest emergency room or call 911.    [facility] [address], [place], [place]  [address] ? MAIN: [phone] ? TOLL FREE: [phone] ? FAX [phone] [email or web address]  Colon Polyps A polyp is extra tissue that grows inside your body. Colon polyps grow in the large intestine. The large intestine, also called the colon, is part  of your digestive system. It is a long, hollow tube at the end of your digestive tract where your body makes and stores stool. Most polyps are not dangerous. They are benign. This means they are not cancerous. But over time, some types of polyps can turn into cancer. Polyps that are smaller than a pea are usually not harmful. But larger polyps could someday become or may already be cancerous. To be safe, doctors remove all polyps and test them.  WHO GETS POLYPS? Anyone can get polyps, but certain people are more likely than others. You may have a greater chance of getting polyps if:  You are over 50.   You have had polyps before.   Someone in your family has had polyps.   Someone in your family has had cancer of the large intestine.   Find out if someone in your family has had polyps. You may also be more likely to get polyps if you:   Eat a lot of fatty foods.   Smoke.   Drink alcohol.   Do not exercise.   Eat too much.  SYMPTOMS  Most small polyps do not cause  symptoms. People often do not know they have one until their caregiver finds it during a regular checkup or while testing them for something else. Some people do have symptoms like these:  Bleeding from the anus. You might notice blood on your underwear or on toilet paper after you have had a bowel movement.   Constipation or diarrhea that lasts more than a week.   Blood in the stool. Blood can make stool look black or it can show up as red streaks in the stool.  If you have any of these symptoms, see your caregiver. HOW DOES THE DOCTOR TEST FOR POLYPS? The doctor can use four tests to check for polyps:  Digital rectal exam. The caregiver wears gloves and checks your rectum (the last part of the large intestine) to see if it feels normal. This test would find polyps only in the rectum. Your caregiver may need to do one of the other tests listed below to find polyps higher up in the intestine.   Barium enema. The caregiver puts a liquid called barium into your rectum before taking x-rays of your large intestine. Barium makes your intestine look white in the pictures. Polyps are dark, so they are easy to see.   Sigmoidoscopy. With this test, the caregiver can see inside your large intestine. A thin flexible tube is placed into your rectum. The device is called a sigmoidoscope, which has a light and a tiny video camera in it. The caregiver uses the sigmoidoscope to look at the last third of your large intestine.   Colonoscopy. This test is like sigmoidoscopy, but the caregiver looks at all of the large intestine. It usually requires sedation. This is the most common method for finding and removing polyps.  TREATMENT   The caregiver will remove the polyp during sigmoidoscopy or colonoscopy. The polyp is then tested for cancer.   If you have had polyps, your caregiver may want you to get tested regularly in the future.  PREVENTION  There is not one sure way to prevent polyps. You might be able to  lower your risk of getting them if you:  Eat more fruits and vegetables and less fatty food.   Do not smoke.   Avoid alcohol.   Exercise every day.   Lose weight if you are overweight.   Eating more  calcium and folate can also lower your risk of getting polyps. Some foods that are rich in calcium are milk, cheese, and broccoli. Some foods that are rich in folate are chickpeas, kidney beans, and spinach.   Aspirin might help prevent polyps. Studies are under way.  Document Released: 05/29/2004 Document Revised: 05/15/2011 Document Reviewed: 11/04/2007 [facility] Patient Information 2012 [place], [place].

## 2011-10-31 NOTE — Progress Notes (Signed)
Pt to be discharged , discharge instructions reviewed with patient and prescription given, pt to follow up with MD Gross within 1 wk, questions and concerns answered Means, Cassady Stanczak N 10-31-11 8:34am

## 2011-11-01 ENCOUNTER — Telehealth (INDEPENDENT_AMBULATORY_CARE_PROVIDER_SITE_OTHER): Payer: Self-pay | Admitting: Surgery

## 2011-11-04 ENCOUNTER — Telehealth (INDEPENDENT_AMBULATORY_CARE_PROVIDER_SITE_OTHER): Payer: Self-pay

## 2011-11-04 NOTE — Telephone Encounter (Signed)
LMOM for pt  Telling her the f/u appt with Dr Michaell Cowing on 11-13-11 arrive at 12:00pm.

## 2011-11-06 ENCOUNTER — Other Ambulatory Visit: Payer: Self-pay | Admitting: Obstetrics and Gynecology

## 2011-11-13 ENCOUNTER — Encounter (INDEPENDENT_AMBULATORY_CARE_PROVIDER_SITE_OTHER): Payer: Self-pay | Admitting: Surgery

## 2011-11-13 ENCOUNTER — Ambulatory Visit (INDEPENDENT_AMBULATORY_CARE_PROVIDER_SITE_OTHER): Payer: Commercial Managed Care - PPO | Admitting: Surgery

## 2011-11-13 VITALS — BP 118/70 | HR 68 | Temp 97.8°F | Resp 16 | Ht 62.5 in | Wt 119.4 lb

## 2011-11-13 DIAGNOSIS — C20 Malignant neoplasm of rectum: Secondary | ICD-10-CM

## 2011-11-13 NOTE — Progress Notes (Signed)
Subjective:     Patient ID: Cheryl Holland, female   DOB: 05-Jul-1961, 51 y.o.   MRN: [id number]  HPI  Cheryl Holland  09-Mar-1961 [id number]  Patient Care Team: Minda Meo, MD as PCP - General (Internal Medicine) Yancey Flemings, MD as Consulting Physician (Gastroenterology) Rob Bunting, MD as Consulting Physician (Gastroenterology)  This patient is a 51 y.o.female who presents today for surgical evaluation.   Procedure: Partial proctectomy by TEM  10/23/2011  Pathology: No remaining cancer. Cancer noted in the polypectomy specimen  Patient comes in today feeling well. Had some shoulder and upper abdominal pain the first few days postop. It was rather intense. It gradually faded. Completely gone now. Eating well. Already working out in the gym. Not back to her full routine just yet but quite active.  Some mild rectal fullness. Some noticeable differences with evacuation. However they are subtle. Not severe. No bleeding. Energy level coming back. Appetite returning.  Patient Active Problem List  Diagnoses  . Rectal cancer in proximal rectal polyp s/p polypectomy & TEM     Past Medical History  Diagnosis Date  . Allergy     shimp  . Gallstones     SEEN ON CT REPORT 10/21/11  . Cyst of right kidney     PER CT REPORT 10/21/11  . Pulmonary nodules     SEEN ON CT REPORT 10/21/11--AND NOTE FROM DR. PERRY THAT PT AND HUSBAND NOTIFIED--PT MAY NEED FOLLOW UP CT CHEST IN 6 MONTHS  . Rectal cancer in proximal rectal polyp s/p polypectomy 10/18/2011    Past Surgical History  Procedure Date  . Foot ganglion excision 2004 - approximate    right  . Inguinal hernia repair 2001    right  . Inguinal hernia repair   . Eus 10/18/2011    Procedure: LOWER ENDOSCOPIC ULTRASOUND (EUS);  Surgeon: Rob Bunting, MD;  Location: [facility];  Service: Endoscopy;  Laterality: N/A;  tattoo,  moderate sedation, TR  . Tem proctoscopy 10/30/11    History   Social History  . Marital Status: Married   Spouse Name: N/A    Number of Children: N/A  . Years of Education: N/A   Occupational History  . Not on file.   Social History Main Topics  . Smoking status: Never Smoker   . Smokeless tobacco: Never Used  . Alcohol Use: 0.0 oz/week     glass of wine 2 or 3 times per week  . Drug Use: No  . Sexually Active: Not on file   Other Topics Concern  . Not on file   Social History Narrative   Spouse is Dr. Gretta Began, vascular surgeon    Family History  Problem Relation Age of Onset  . Colon cancer Neg Hx   . Dementia Father     Current outpatient prescriptions:calcium carbonate (OS-CAL) 600 MG TABS, Take 600 mg by mouth 2 (two) times daily with a meal., Disp: , Rfl: ;  cholecalciferol (VITAMIN D) 1000 UNITS tablet, Take 1,000 Units by mouth daily., Disp: , Rfl: ;  EPINEPHrine (EPIPEN JR) 0.15 MG/0.3ML injection, Inject 0.15 mg into the muscle as needed., Disp: , Rfl: ;  fish oil-omega-3 fatty acids 1000 MG capsule, Take 1 g by mouth daily., Disp: , Rfl:  furosemide (LASIX) 40 MG tablet, Take 40 mg by mouth daily before breakfast. , Disp: , Rfl: ;  ibuprofen (ADVIL,MOTRIN) 200 MG tablet, Take 600 mg by mouth every 6 (six) hours as needed. Pain, Disp: , Rfl: ;  Multiple Vitamins-Minerals (MULTIVITAMIN WITH MINERALS) tablet, Take 1 tablet by mouth daily., Disp: , Rfl: ;  potassium chloride (KLOR-CON) 8 MEQ tablet, Take 8 mEq by mouth daily., Disp: , Rfl:   Allergies  Allergen Reactions  . Shrimp (Shellfish Allergy) Anaphylaxis, Hives and Swelling    BP 118/70  Pulse 68  Temp(Src) 97.8 F (36.6 C) (Temporal)  Resp 16  Ht 5' 2.5" (1.588 m)  Wt 119 lb 6.4 oz (54.159 kg)  BMI 21.49 kg/m2     Review of Systems  Constitutional: Negative for fever, chills and diaphoresis.  HENT: Negative for ear pain, sore throat and trouble swallowing.   Eyes: Negative for photophobia and visual disturbance.  Respiratory: Negative for cough and choking.   Cardiovascular: Negative for chest pain  and palpitations.  Gastrointestinal: Negative for nausea, vomiting, abdominal pain, diarrhea, constipation, blood in stool, abdominal distention, anal bleeding and rectal pain.  Genitourinary: Negative for dysuria, frequency and difficulty urinating.  Musculoskeletal: Negative for myalgias and gait problem.  Skin: Negative for color change, pallor and rash.  Neurological: Negative for dizziness, speech difficulty, weakness and numbness.  Hematological: Negative for adenopathy.  Psychiatric/Behavioral: Negative for confusion and agitation. The patient is not nervous/anxious.        Objective:   Physical Exam  Constitutional: She is oriented to person, place, and time. She appears well-developed and well-nourished. No distress.  HENT:  Head: Normocephalic.  Mouth/Throat: Oropharynx is clear and moist. No oropharyngeal exudate.  Eyes: Conjunctivae and EOM are normal. Pupils are equal, round, and reactive to light. No scleral icterus.  Neck: Normal range of motion. No tracheal deviation present.  Cardiovascular: Normal rate and intact distal pulses.   Pulmonary/Chest: Effort normal. No respiratory distress. She exhibits no tenderness.  Abdominal: Soft. She exhibits no distension. There is no tenderness. Hernia confirmed negative in the right inguinal area and confirmed negative in the left inguinal area.       Incisions clean with normal healing ridges.  No hernias  Genitourinary: No vaginal discharge found.       Rectal exam deferred this visit  Musculoskeletal: Normal range of motion. She exhibits no tenderness.  Lymphadenopathy:       Right: No inguinal adenopathy present.       Left: No inguinal adenopathy present.  Neurological: She is alert and oriented to person, place, and time. No cranial nerve deficit. She exhibits normal muscle tone. Coordination normal.  Skin: Skin is warm and dry. No rash noted. She is not diaphoretic.  Psychiatric: She has a normal mood and affect. Her  behavior is normal.       Assessment:     2 weeks s/p TEM partial proctectomy of the polypectomy site with no remaining cancer in a very healthy female.    Plan:     Close local surveillance with q63mon rectal exam 1st year, q96mon 2nd year, then annually.  Colonoscopy in 1 year.  Will discuss w GI tumor board.  RTC 3months.  Call if further issues

## 2011-11-13 NOTE — Patient Instructions (Signed)

## 2011-11-18 ENCOUNTER — Encounter (HOSPITAL_COMMUNITY): Payer: Self-pay | Admitting: Surgery

## 2011-11-26 ENCOUNTER — Encounter (INDEPENDENT_AMBULATORY_CARE_PROVIDER_SITE_OTHER): Payer: Self-pay | Admitting: Surgery

## 2011-11-26 ENCOUNTER — Telehealth: Payer: Self-pay | Admitting: Oncology

## 2011-11-26 ENCOUNTER — Ambulatory Visit (HOSPITAL_BASED_OUTPATIENT_CLINIC_OR_DEPARTMENT_OTHER): Payer: 59 | Admitting: Oncology

## 2011-11-26 VITALS — BP 124/71 | HR 81 | Temp 98.3°F | Ht 62.5 in | Wt 120.8 lb

## 2011-11-26 DIAGNOSIS — C20 Malignant neoplasm of rectum: Secondary | ICD-10-CM

## 2011-11-26 NOTE — Progress Notes (Signed)
OFFICE PROGRESS NOTE   INTERVAL HISTORY:   She underwent a partial proctectomy be a transanal approach by Dr. Michaell Cowing on 10/30/2011. The pathology ([id number]) revealed an ulcer with fibrosis and inflammation. Tattoo pigment was present. No residual carcinoma. There were 2 benign lymph nodes. The resection margins were negative. She reports an uneventful operative recovery after resolution of pain related to a peritoneal breech.  Objective:  Vital signs in last 24 hours:  Blood pressure 124/71, pulse 81, temperature 98.3 F (36.8 C), temperature source Oral, height 5' 2.5" (1.588 m), weight 120 lb 12.8 oz (54.795 kg).   Physical examination not performed today  Medications: I have reviewed the patient's current medications.  Assessment/Plan:  1. Invasive adenocarcinoma involving a rectal polyp, status post a trans-anal partial proctectomy 10/30/2011, stage I (T1 N0)  2. History of multiple benign colon polyps  3. Nonspecific small pulmonary nodules on a CT 10/21/2011-likely benign  Disposition:  There was no residual carcinoma found at the transanal excision. She has therefore been diagnosed with a stage I rectal cancer. There is no indication for adjuvant systemic chemotherapy or radiation. She has an excellent prognosis for long-term disease-free survival.  She plans to continue followup for local recurrence with Dr.Gross and colonoscopy followup with Dr. Marina Goodell.  The tumor from the original polypectomy returned microsatellite stable, making it very unlikely for her to have a hereditary non-polyposis colon cancer.  We discussed the data suggesting a decrease in: Cancer recurrence with daily aspirin therapy in patients not taking aspirin on a regular schedule prior to surgery. She plans to begin aspirin therapy. We also discussed a healthy diet and exercise which may decrease the risk of colorectal cancer recurrence. We discussed the NSABP-P5 prevention study which randomizes patients  to placebo versus Crestor. She does not appear eligible for this clinical trial since it is limited to Colon Cancer patients.  Her children should be screened for colorectal cancer at approximately age 61.  The lung nodules on the February CT are likely a benign finding. She would feel most comfortable with a followup CT of the chest. We scheduled this for August of 2013.  We did not schedule a followup appointment at cancer Center. I will see her in the future as needed.   Lucile Shutters, MD  11/26/2011  5:11 PM

## 2011-11-26 NOTE — Telephone Encounter (Signed)
Ct appt made and printed for august and per pt dr s does not need to see her again.

## 2012-02-05 ENCOUNTER — Ambulatory Visit (INDEPENDENT_AMBULATORY_CARE_PROVIDER_SITE_OTHER): Payer: Commercial Managed Care - PPO | Admitting: Surgery

## 2012-02-05 ENCOUNTER — Encounter (INDEPENDENT_AMBULATORY_CARE_PROVIDER_SITE_OTHER): Payer: Self-pay | Admitting: Surgery

## 2012-02-05 VITALS — BP 116/76 | HR 70 | Temp 97.6°F | Resp 12 | Ht 62.5 in | Wt 117.4 lb

## 2012-02-05 DIAGNOSIS — C20 Malignant neoplasm of rectum: Secondary | ICD-10-CM

## 2012-02-05 NOTE — Progress Notes (Signed)
Subjective:     Patient ID: Cheryl Holland, female   DOB: [birth year], 51 y.o.   MRN: [id number]  HPI  [name]  [birth year] [id number]  Patient Care Team: Minda Meo, MD as PCP - General (Internal Medicine) Hilarie Fredrickson, MD as Consulting Physician (Gastroenterology) Rachael Fee, MD as Consulting Physician (Gastroenterology)  This patient is a 51 y.o.female who presents today for surgical evaluation.   Reason for visit: Three-month followup status post partial proctectomy for Artist rectal cancer.  The patient comes in today feeling well. Eating well. Back to exercising in the gym on a daily basis. Still has some rectal fullness and mild urgency, but much improved.  She will have 3-4 smaller bowel movements in the morning, caliber mildly narrowed but not too bad. Getting better. Energy level good. No bleeding.  Patient Active Problem List  Diagnoses  . T1sm1 Rectal cancer in proximal rectal polyp s/p polypectomy & f/u partial proctectomy by TEM     Past Medical History  Diagnosis Date  . Allergy     shimp  . Gallstones     SEEN ON CT REPORT 10/21/11  . Cyst of right kidney     PER CT REPORT 10/21/11  . Pulmonary nodules     SEEN ON CT REPORT 10/21/11--AND NOTE FROM DR. PERRY THAT PT AND HUSBAND NOTIFIED--PT MAY NEED FOLLOW UP CT CHEST IN 6 MONTHS  . Rectal cancer in proximal rectal polyp s/p polypectomy 10/18/2011    Past Surgical History  Procedure Date  . Foot ganglion excision 2004 - approximate    right  . Inguinal hernia repair 2001    right  . Inguinal hernia repair   . Eus 10/18/2011    Procedure: LOWER ENDOSCOPIC ULTRASOUND (EUS);  Surgeon: Rob Bunting, MD;  Location: [facility];  Service: Endoscopy;  Laterality: N/A;  tattoo,  moderate sedation, TR  . Tem proctoscopy 10/30/11  . Proctoscopy 10/30/2011    Procedure: PROCTOSCOPY;  Surgeon: Ardeth Sportsman, MD;  Location: [facility];  Service: General;  Laterality: N/A;    History   Social History  . Marital  Status: Married    Spouse Name: N/A    Number of Children: N/A  . Years of Education: N/A   Occupational History  . Not on file.   Social History Main Topics  . Smoking status: Never Smoker   . Smokeless tobacco: Never Used  . Alcohol Use: 0.0 oz/week     glass of wine 2 or 3 times per week  . Drug Use: No  . Sexually Active: Not on file   Other Topics Concern  . Not on file   Social History Narrative   Spouse is Dr. Gretta Began, vascular surgeon    Family History  Problem Relation Age of Onset  . Colon cancer Neg Hx   . Dementia Father     Current Outpatient Prescriptions  Medication Sig Dispense Refill  . furosemide (LASIX) 40 MG tablet Take 40 mg by mouth daily before breakfast.       . potassium chloride (KLOR-CON) 8 MEQ tablet Take 8 mEq by mouth daily.      . calcium carbonate (OS-CAL) 600 MG TABS Take 600 mg by mouth as needed.       . cholecalciferol (VITAMIN D) 1000 UNITS tablet Take 1,000 Units by mouth as needed.       Marland Kitchen EPINEPHrine (EPIPEN JR) 0.15 MG/0.3ML injection Inject 0.15 mg into the muscle as needed.      Marland Kitchen  fish oil-omega-3 fatty acids 1000 MG capsule Take 1 g by mouth as needed.       Marland Kitchen ibuprofen (ADVIL,MOTRIN) 200 MG tablet Take 600 mg by mouth as needed. Pain       . Multiple Vitamins-Minerals (MULTIVITAMIN WITH MINERALS) tablet Take 1 tablet by mouth as needed.          Allergies  Allergen Reactions  . Shrimp (Shellfish Allergy) Anaphylaxis, Hives and Swelling    BP 116/76  Pulse 70  Temp(Src) 97.6 F (36.4 C) (Temporal)  Resp 12  Ht 5' 2.5" (1.588 m)  Wt 117 lb 6.4 oz (53.252 kg)  BMI 21.13 kg/m2  No results found.   Review of Systems  Constitutional: Negative for fever, chills and diaphoresis.  HENT: Negative for ear pain, sore throat and trouble swallowing.   Eyes: Negative for photophobia and visual disturbance.  Respiratory: Negative for cough and choking.   Cardiovascular: Negative for chest pain and palpitations.        Works out at gym 2 hours/day w/o problems  Gastrointestinal: Negative for nausea, vomiting, abdominal pain, diarrhea, constipation, blood in stool, abdominal distention, anal bleeding and rectal pain.  Genitourinary: Negative for dysuria, frequency and difficulty urinating.  Musculoskeletal: Negative for myalgias and gait problem.  Skin: Negative for color change, pallor and rash.  Neurological: Negative for dizziness, speech difficulty, weakness and numbness.  Hematological: Negative for adenopathy.  Psychiatric/Behavioral: Negative for confusion and agitation. The patient is not nervous/anxious.        Objective:   Physical Exam  Constitutional: She is oriented to person, place, and time. She appears well-developed and well-nourished. No distress.  HENT:  Head: Normocephalic.  Mouth/Throat: Oropharynx is clear and moist. No oropharyngeal exudate.  Eyes: Conjunctivae and EOM are normal. Pupils are equal, round, and reactive to light. No scleral icterus.  Neck: Normal range of motion. No tracheal deviation present.  Cardiovascular: Normal rate and intact distal pulses.   Pulmonary/Chest: Effort normal. No respiratory distress. She exhibits no tenderness.  Abdominal: Soft. She exhibits no distension. There is no tenderness. Hernia confirmed negative in the right inguinal area and confirmed negative in the left inguinal area.       Incisions clean with normal healing ridges.  No hernias  Genitourinary: No vaginal discharge found.       Exam done with assistance of female Medical Assistant in the room.  Perianal skin clean with good hygiene.  No pruritis.  No external skin tags / hemorrhoids of significance.  No pilonidal disease.  No fissure.  No abscess/fistula.    Tolerates digital rectal exam.  Normal sphincter tone.   No rectal masses.  Hemorrhoidal piles WNL  Scar right posterior midline primarily at tip of finger ~9cm from anal verge.  Smooth no mass/ulceration  Musculoskeletal:  Normal range of motion. She exhibits no tenderness.  Lymphadenopathy:       Right: No inguinal adenopathy present.       Left: No inguinal adenopathy present.  Neurological: She is alert and oriented to person, place, and time. No cranial nerve deficit. She exhibits normal muscle tone. Coordination normal.  Skin: Skin is warm and dry. No rash noted. She is not diaphoretic.  Psychiatric: She has a normal mood and affect. Her behavior is normal.       Assessment:     Zirkle rectal cancer found within a polyp s/p TEM partial proctectomy with no evidence of local recurrence    Plan:     Surgical follow-up  after low rectal cancer resection: -Rectal exam Q3 months 1st year after surgery -Colonoscopy at end of 1st year (& probable 4th year) -Rectal exam Q45months 2nd year -Rectal exam annually 3rd-5th years  Continue regular activity as tolerated.  Do not push through pain.  Bowel regimen to avoid problems.  She does not like Metamucil. She is hoping Benefiber will be a better alternative for her  The patient expressed understanding and appreciation

## 2012-02-05 NOTE — Patient Instructions (Addendum)
Surgical follow-up after low rectal cancer resection: -Rectal exam Q3 months 1st year after surgery -Colonoscopy at end of 1st year (& probable 4th year) -Rectal exam Q66months 2nd year -Rectal exam annually 3rd-5th years  GETTING TO GOOD BOWEL HEALTH. Irregular bowel habits such as constipation and diarrhea can lead to many problems over time.  Having one soft bowel movement a day is the most important way to prevent further problems.  The anorectal canal is designed to handle stretching and feces to safely manage our ability to get rid of solid waste (feces, poop, stool) out of our body.  BUT, hard constipated stools can act like ripping concrete bricks and diarrhea can be a burning fire to this very sensitive area of our body, causing inflamed hemorrhoids, anal fissures, increasing risk is perirectal abscesses, abdominal pain/bloating, an making irritable bowel worse.     The goal: ONE SOFT BOWEL MOVEMENT A DAY!  To have soft, regular bowel movements:    Drink at least 8 tall glasses of water a day.     Take plenty of fiber.  Fiber is the undigested part of plant food that passes into the colon, acting s "natures broom" to encourage bowel motility and movement.  Fiber can absorb and hold large amounts of water. This results in a larger, bulkier stool, which is soft and easier to pass. Work gradually over several weeks up to 6 servings a day of fiber (25g a day even more if needed) in the form of: o Vegetables -- Root (potatoes, carrots, turnips), leafy green (lettuce, salad greens, celery, spinach), or cooked high residue (cabbage, broccoli, etc) o Fruit -- Fresh (unpeeled skin & pulp), Dried (prunes, apricots, cherries, etc ),  or stewed ( applesauce)  o Whole grain breads, pasta, etc (whole wheat)  o Bran cereals    Bulking Agents -- This type of water-retaining fiber generally is easily obtained each day by one of the following:  o Psyllium bran -- The psyllium plant is remarkable because its  ground seeds can retain so much water. This product is available as Metamucil, Konsyl, Effersyllium, Per Diem Fiber, or the less expensive generic preparation in drug and health food stores. Although labeled a laxative, it really is not a laxative.  o Methylcellulose -- This is another fiber derived from wood which also retains water. It is available as Citrucel. o Polyethylene Glycol - and "artificial" fiber commonly called Miralax or Glycolax.  It is helpful for people with gassy or bloated feelings with regular fiber o Flax Seed - a less gassy fiber than psyllium   No reading or other relaxing activity while on the toilet. If bowel movements take longer than 5 minutes, you are too constipated   AVOID CONSTIPATION.  High fiber and water intake usually takes care of this.  Sometimes a laxative is needed to stimulate more frequent bowel movements, but    Laxatives are not a good long-term solution as it can wear the colon out. o Osmotics (Milk of Magnesia, Fleets phosphosoda, Magnesium citrate, MiraLax, GoLytely) are safer than  o Stimulants (Senokot, Castor Oil, Dulcolax, Ex Lax)    o Do not take laxatives for more than 7days in a row.    IF SEVERELY CONSTIPATED, try a Bowel Retraining Program: o Do not use laxatives.  o Eat a diet high in roughage, such as bran cereals and leafy vegetables.  o Drink six (6) ounces of prune or apricot juice each morning.  o Eat two (2) large servings of  stewed fruit each day.  o Take one (1) heaping tablespoon of a psyllium-based bulking agent twice a day. Use sugar-free sweetener when possible to avoid excessive calories.  o Eat a normal breakfast.  o Set aside 15 minutes after breakfast to sit on the toilet, but do not strain to have a bowel movement.  o If you do not have a bowel movement by the third day, use an enema and repeat the above steps.    Controlling diarrhea o Switch to liquids and simpler foods for a few days to avoid stressing your intestines  further. o Avoid dairy products (especially milk & ice cream) for a short time.  The intestines often can lose the ability to digest lactose when stressed. o Avoid foods that cause gassiness or bloating.  Typical foods include beans and other legumes, cabbage, broccoli, and dairy foods.  Every person has some sensitivity to other foods, so listen to our body and avoid those foods that trigger problems for you. o Adding fiber (Citrucel, Metamucil, psyllium, Miralax) gradually can help thicken stools by absorbing excess fluid and retrain the intestines to act more normally.  Slowly increase the dose over a few weeks.  Too much fiber too soon can backfire and cause cramping & bloating. o Probiotics (such as active yogurt, Align, etc) may help repopulate the intestines and colon with normal bacteria and calm down a sensitive digestive tract.  Most studies show it to be of mild help, though, and such products can be costly. o Medicines:   Bismuth subsalicylate (ex. Kayopectate, Pepto Bismol) every 30 minutes for up to 6 doses can help control diarrhea.  Avoid if pregnant.   Loperamide (Immodium) can slow down diarrhea.  Start with two tablets (4mg  total) first and then try one tablet every 6 hours.  Avoid if you are having fevers or severe pain.  If you are not better or start feeling worse, stop all medicines and call your doctor for advice o Call your doctor if you are getting worse or not better.  Sometimes further testing (cultures, endoscopy, X-ray studies, bloodwork, etc) may be needed to help diagnose and treat the cause of the diarrhea.   Colorectal Cancer The colon is the large bowel and is the storage part of the bowel for undigested food. The large bowel is also called the intestine or gut. Cancer is a growth that is not supposed to be there.  RISK FACTORS Risks for cancer of the colon include:   Age. More than 90 percent of people with this disease are diagnosed after age 47.   Polyps.  Growths on the inner wall of the colon or rectum may become cancerous.   Family history. Some cancers of the colon are inherited or run in families. These include:   Hereditary nonpolyposis colon cancer (HNPCC) is the most common type of inherited (genetic) colorectal cancer. It accounts for about 2 percent of all colorectal cancer cases. It is caused by genetic changes. About 75% of people with an altered HNPCC gene develop colon cancer, and the average age at diagnosis of colon cancer is 18.   Familial adenomatous polyposis (FAP) is a rare inherited condition in which hundreds of polyps form in the colon and rectum. It is caused by a change in a specific gene called APC. Unless FAP is treated, it usually leads to colorectal cancer by age 73. FAP accounts for less than 1 percent of all colorectal cancer cases.   Family members of people who have  HNPCC or FAP can have genetic testing to check for specific genetic changes. For those who have changes in their genes, health care providers may suggest ways to try to reduce the risk of colorectal cancer or to improve the detection of this disease. For adults with FAP, the doctor may recommend an operation to remove all or part of the colon and rectum.   Personal history of colorectal cancer. A person who has already had colorectal cancer may develop colorectal cancer a second time. Also, women with a history of cancer of the ovary, uterus (endometrium), or breast are at a somewhat higher risk of developing colorectal cancer.   Ulcerative colitis or Crohn's disease. A person who has had a condition that causes inflammation of the colon (such as ulcerative colitis or Crohn's disease) for many years is at increased risk of developing colorectal cancer.   Diet. Studies suggest that diets high in fat (especially animal fat) and low in calcium, folate, and fiber may increase the risk of colorectal cancer. Also, some studies suggest that people who eat a diet very  low in fruits and vegetables may have a higher risk of colorectal cancer. More research is needed to better understand how diet affects the risk of colorectal cancer.   Cigarette smoking. A person who smokes cigarettes may be at increased risk of developing polyps and colorectal cancer.  SYMPTOMS  Changes in bowel habits.   Diarrhea, constipation, or feeling that the bowel does not empty completely.   Blood (either bright red or very dark) in the stool.   Stools that are narrower than usual.   General discomfort in your belly (abdomen): frequent gas pains, bloating, fullness, and/or cramps.   Weight loss with no known reason.   Constant tiredness.   Nausea and vomiting.  Other health problems can cause the same symptoms, and often these symptoms are not due to cancer. Anyone with these symptoms should see a doctor so that any problem can be diagnosed and treated as [place] as possible. Usually, [place] cancer does not cause pain. It is important not to wait to feel pain before seeing a doctor. DIAGNOSIS  If you have any signs or symptoms of colorectal cancer, the doctor must determine whether they are due to cancer or some other cause. The doctor will ask about personal and family medical history and may do a physical exam. You may have one or more tests to screen for cancer. If the physical exam and test results do not suggest cancer, the doctor may decide that no further tests are needed and no treatment is necessary. Your caregiver may recommend a schedule for checkups. If X-rays show an abnormal area (such as a polyp), a piece of tissue is taken (biopsy) to check for cancer cells. Often, the abnormal tissue can be removed during a test that examines the colon. These tests include:  Sigmoidoscopy. With this test, the caregiver can see inside your large intestine. A thin flexible tube is placed into your rectum. The device is called a sigmoidoscope. This device has a light and a tiny video  camera in it. The caregiver uses the sigmoidoscope to look at the last third of your large intestine.   Colonoscopy. This test is like sigmoidoscopy, but the caregiver looks at all of the large intestine. It usually requires sedation.  If a biopsy was taken, a specialist in examining tissues (pathologist) checks the tissue for cancer cells using a microscope.  If the biopsy shows that cancer is present,  the doctor needs to know the extent (stage) of the disease to plan the best treatment. The stage is based on whether the tumor has invaded nearby tissues, whether the cancer has spread, and if so, to what parts of the body. Staging may involve some of the following tests and procedures:  Blood tests. The doctor checks for carcinoembryonic antigen (CEA) and other substances in the blood. Some people who have colorectal cancer have a high CEA level.   Sigmoidoscopy.   Colonoscopy.   Endorectal ultrasound. An ultrasound probe is inserted into the rectum. The probe sends out sound waves that people cannot hear. The waves bounce off the rectum and nearby tissues, and a computer uses the echoes to create a picture. The picture shows how deep a rectal tumor has grown or whether the cancer has spread to lymph nodes or other nearby tissues.   Chest X-ray. X-rays of the chest can show whether cancer has spread to the lungs.   CT scan. This is a X-ray machine linked to a computer takes pictures of areas inside the body. The patient may receive an injection of dye. Tumors in the liver, lungs, or elsewhere in the body show up on the CT scan.  The doctor also may use other tests (such as MRI) to see whether the cancer has spread. Sometimes staging is not complete until the patient has surgery to remove the tumor. (Surgery for colorectal cancer is described in the "Treatment" section.) DOCTORS DESCRIBE COLORECTAL CANCER BY THE FOLLOWING STAGES:   Stage 0. The cancer is found only in the innermost lining of the  colon or rectum. Another name for Stage 0 colorectal cancer is Carcinoma in situ.   Stage I. The cancer has grown into the inner wall of the colon or rectum. The tumor has not reached the outer wall of the colon or extended outside the colon. Another name for Stage I colorectal cancer is Dukes' A.   Stage II. The tumor extends more deeply into or through the wall of the colon or rectum. It may have invaded nearby tissue, but cancer cells have not spread to the lymph nodes. Another name for Stage II colorectal cancer is Dukes' B.   Stage III. The cancer has spread to nearby lymph nodes but not to other parts of the body. Another name for Stage III colorectal cancer is Dukes' C.   Stage IV. The cancer has spread to other parts of the body, such as the liver or lungs. Another name for Stage IV colorectal cancer is Dukes' D.   Recurrent cancer. This is cancer that has been treated and has returned after a period of time when the cancer could not be detected. The disease may return in the colon, rectum or in another part of the body.  Once you know the diagnosis and the stage of cancer of the colon, your caregiver can advise you and help you decide what the best course of treatment will be.  Document Released: 09/02/2005 Document Revised: 08/22/2011 Document Reviewed: 08/20/2011 [facility] Patient Information 2012 [place], [place].

## 2012-04-03 ENCOUNTER — Encounter: Payer: Self-pay | Admitting: Internal Medicine

## 2012-04-29 ENCOUNTER — Other Ambulatory Visit (HOSPITAL_COMMUNITY): Payer: 59

## 2012-04-30 ENCOUNTER — Ambulatory Visit (AMBULATORY_SURGERY_CENTER): Payer: 59 | Admitting: *Deleted

## 2012-04-30 VITALS — Ht 62.5 in | Wt 113.0 lb

## 2012-04-30 DIAGNOSIS — Z1211 Encounter for screening for malignant neoplasm of colon: Secondary | ICD-10-CM

## 2012-04-30 MED ORDER — MOVIPREP 100 G PO SOLR: ORAL | Status: DC

## 2012-05-04 ENCOUNTER — Ambulatory Visit (HOSPITAL_COMMUNITY)
Admission: RE | Admit: 2012-05-04 | Discharge: 2012-05-04 | Disposition: A | Discharge status: Home / Self Care | Payer: 59 | Source: Ambulatory Visit | Attending: Oncology | Admitting: Oncology

## 2012-05-04 ENCOUNTER — Telehealth: Payer: Self-pay | Admitting: *Deleted

## 2012-05-04 DIAGNOSIS — R918 Other nonspecific abnormal finding of lung field: Secondary | ICD-10-CM | POA: Insufficient documentation

## 2012-05-04 DIAGNOSIS — C20 Malignant neoplasm of rectum: Secondary | ICD-10-CM | POA: Insufficient documentation

## 2012-05-04 NOTE — Telephone Encounter (Signed)
Call from pt requesting Ct results. CT report reviewed by Dr. Truett Perna: looks good. She voiced understanding. Asks if she will need further surveillance. Will review with Dr. Truett Perna.

## 2012-05-06 ENCOUNTER — Encounter (INDEPENDENT_AMBULATORY_CARE_PROVIDER_SITE_OTHER): Payer: Commercial Managed Care - PPO | Admitting: Surgery

## 2012-05-11 ENCOUNTER — Other Ambulatory Visit: Payer: Self-pay | Admitting: Obstetrics and Gynecology

## 2012-05-14 ENCOUNTER — Ambulatory Visit (AMBULATORY_SURGERY_CENTER): Payer: 59 | Admitting: Internal Medicine

## 2012-05-14 ENCOUNTER — Encounter: Payer: Self-pay | Admitting: Internal Medicine

## 2012-05-14 VITALS — BP 120/59 | HR 69 | Temp 97.8°F | Resp 20 | Ht 62.5 in | Wt 113.0 lb

## 2012-05-14 DIAGNOSIS — Z85038 Personal history of other malignant neoplasm of large intestine: Secondary | ICD-10-CM

## 2012-05-14 DIAGNOSIS — Z1211 Encounter for screening for malignant neoplasm of colon: Secondary | ICD-10-CM

## 2012-05-14 DIAGNOSIS — D126 Benign neoplasm of colon, unspecified: Secondary | ICD-10-CM

## 2012-05-14 DIAGNOSIS — L918 Other hypertrophic disorders of the skin: Secondary | ICD-10-CM

## 2012-05-14 MED ORDER — SODIUM CHLORIDE 0.9 % IV SOLN: 500.0000 mL | INTRAVENOUS | Status: DC

## 2012-05-14 NOTE — Op Note (Signed)
[facility] [address] [place], [address]   COLONOSCOPY PROCEDURE REPORT  PATIENT: Cheryl, Holland  MR#: [id number] BIRTHDATE: 1961-09-15 , 51  yrs. old GENDER: Female ENDOSCOPIST: Roxy Cedar, MD REFERRED [id number] Program Recall PROCEDURE DATE:  05/14/2012 PROCEDURE:   Colonoscopy with biopsies and Colonoscopy with snare polypectomy    x 1 ASA CLASS:   Class I INDICATIONS:high risk patient with personal history of rectal cancer (T1sm1polyp removed 09-2011; Follow up surgery - transanal excision negative for tumor). MEDICATIONS: MAC sedation, administered by CRNA and propofol (Diprivan) 670mg  IV  DESCRIPTION OF PROCEDURE:   After the risks benefits and alternatives of the procedure were thoroughly explained, informed consent was obtained.  A digital rectal exam revealed no abnormalities of the rectum.   The LB [id number]  endoscope was introduced through the anus and advanced to the cecum, which was identified by both the appendix and ileocecal valve. No adverse events experienced.   The quality of the prep was excellent, using MoviPrep  The instrument was then slowly withdrawn as the colon was fully examined.      COLON FINDINGS: A single polyp measuring 1 mm in size was found in the sigmoid colon.  A polypectomy was performed with a cold snare. The resection was complete with no meaningful tissue available for submission.   Mild diverticulosis was noted in the sigmoid colon. There was evidence of a prior surgiery in the rectum at 8-10 cm from verge. Scar was present and a small anflammatory appearing nodule that was removed with cold bx forceps.  Retroflexed views revealed no abnormalities. The time to cecum=5 minutes 22 seconds. Withdrawal time=17 minutes 29 seconds.  The scope was withdrawn and the procedure completed. COMPLICATIONS: There were no complications.  ENDOSCOPIC IMPRESSION: 1.   Single polyp measuring 1 mm in size was  found in the sigmoid colon- removed 2.   Mild diverticulosis was noted in the sigmoid colon 3.   Prior surgical unremarkable with scar and small inflammatory appearing nodule (removed with bx forceps)  RECOMMENDATIONS: 1. repeat Colonoscopy in 1 year.   eSigned:  Roxy Cedar, MD 05/14/2012 12:48 PM  cc: Geoffry Paradise, MD, Karie Soda, MD, Marcelle Overlie, MD, Rolm Baptise, MD, and The Patient   PATIENT NAME:  Cheryl Holland, Cheryl Holland MR#: [id number]

## 2012-05-14 NOTE — Patient Instructions (Addendum)

## 2012-05-14 NOTE — Progress Notes (Signed)
Patient did not experience any of the following events: a burn prior to discharge; a fall within the facility; wrong site/side/patient/procedure/implant event; or a hospital transfer or hospital admission upon discharge from the facility. (G8907) Patient did not have preoperative order for IV antibiotic SSI prophylaxis. (G8918)  

## 2012-05-14 NOTE — Progress Notes (Signed)
Propofol given and oxygen managed per S [place] CRNA 

## 2012-05-15 ENCOUNTER — Telehealth: Payer: Self-pay | Admitting: *Deleted

## 2012-05-15 NOTE — Telephone Encounter (Signed)
  Follow up Call-  Call back number 05/14/2012 10/15/2011  Post procedure Call Back phone  # [phone] cell [phone]  Permission to leave phone message Yes Yes     Patient questions:  Do you have a fever, pain , or abdominal swelling? no Pain Score  0 *  Have you tolerated food without any problems? yes  Have you been able to return to your normal activities? yes  Do you have any questions about your discharge instructions: Diet   no Medications  no Follow up visit  no  Do you have questions or concerns about your Care? no  Actions: * If pain score is 4 or above: No action needed, pain <4.

## 2012-05-25 ENCOUNTER — Encounter: Payer: Self-pay | Admitting: Internal Medicine

## 2012-08-17 ENCOUNTER — Ambulatory Visit (INDEPENDENT_AMBULATORY_CARE_PROVIDER_SITE_OTHER): Payer: Self-pay | Admitting: Surgery

## 2012-08-19 ENCOUNTER — Ambulatory Visit (INDEPENDENT_AMBULATORY_CARE_PROVIDER_SITE_OTHER): Payer: Self-pay | Admitting: Surgery

## 2012-08-31 ENCOUNTER — Encounter (INDEPENDENT_AMBULATORY_CARE_PROVIDER_SITE_OTHER): Payer: Self-pay | Admitting: Surgery

## 2012-08-31 ENCOUNTER — Ambulatory Visit (INDEPENDENT_AMBULATORY_CARE_PROVIDER_SITE_OTHER): Payer: Commercial Managed Care - PPO | Admitting: Surgery

## 2012-08-31 VITALS — BP 124/72 | HR 73 | Temp 97.8°F | Ht 62.5 in | Wt 117.2 lb

## 2012-08-31 DIAGNOSIS — C20 Malignant neoplasm of rectum: Secondary | ICD-10-CM

## 2012-08-31 NOTE — Patient Instructions (Addendum)
Colorectal Cancer Colorectal cancer is an abnormal growth of tissue (tumor) in the colon or rectum that is cancerous (malignant). Unlike noncancerous (benign) tumors, malignant tumors can spread to other parts of your body. The colon is the large bowel or large intestine. The rectum is the last several inches of the colon. CAUSES  The exact cause of colon cancer is unknown.  RISK FACTORS The majority of patients do not have identifiable risk factors, but the following factors may increase your chances of getting colon cancer:  Age. Most colorectal cancers occur in people older than 50 years.  Having abnormal growths (polyps) on the inner wall of the colon or rectum.  Diabetes.  Being African American.  Family history of hereditary nonpolyposis colon cancer. This condition is caused by changes in the genes that are responsible for repairing mismatched DNA.  Family history of familial adenomatous polyposis (FAP). This is a rare, inherited condition in which hundreds of polyps form in the colon and rectum. It is caused by a change in the APC gene. Unless FAP is treated, it usually leads to colorectal cancer by age 52.  Personal history of cancer. A person who has already had colorectal cancer may develop it a second time. Also, women with a history of ovarian, uterine, or breast cancer are at a somewhat higher risk of developing colorectal cancer.  Inflammatory bowel disease, including ulcerative colitis and Crohn's disease.  Being obese or eating a diet that is high in fat (especially animal fat) and low in fiber, fruits, and vegetables.  Smoking. A person who smokes cigarettes may be at increased risk of developing polyps and colorectal cancer.  Heavy alcohol use. SYMPTOMS L[place]berger colorectal cancer often does not cause symptoms. As the cancer grows, symptoms may include:  Diarrhea.  Constipation.  Feeling like the bowel does not empty completely after a bowel movement.  Blood in the  stool.  Stools that are narrower than usual.  Abdominal discomfort, pain, bloating, fullness, or cramps.  Unexplained weight loss.  Constant tiredness.  Nausea and vomiting. DIAGNOSIS  Your caregiver will ask about your medical history. He or she may also perform a number of procedures, such as:  A physical exam.  Blood tests. This may include a routine complete blood count and iron level testing. Your caregiver may also check for carcinoembryonic antigen (CEA) and other substances in the blood. Some people who have colorectal cancer have a high CEA level. These levels may be used to follow the activity of your colon cancer.  Chest X-rays, computed tomography (CT) scans, or magnetic resonance imaging (MRI).  Taking a tissue sample (biopsy) from the colon or rectum. The sample is examined under a microscope to look for cancer cells.  Sigmoidoscopy. With this test, your caregiver can see inside your colon. A thin flexible tube (sigmoidoscope) is placed into your rectum. This device has a light source and a tiny video camera in it. Your caregiver uses the sigmoidoscope to look at the last third of your colon.  Colonoscopy. This test is like sigmoidoscopy, but your caregiver looks at the entire colon. This test usually requires medicine that helps you relax (sedative).  Endorectal ultrasound. With this test, your caregiver can see how deep a rectal tumor has grown and whether the cancer has spread to lymph nodes or other nearby tissues. A tool (probe) is inserted into the rectum. The probe sends out sound waves to the rectum and nearby tissues, and a computer uses the echoes to create a picture.  Your cancer will be staged to determine its severity and extent. Staging is a careful attempt to find out the size of the tumor, whether the cancer has spread, and if so, to what parts of the body. You may need to have more tests to determine the stage of your cancer. The test results will help  determine what treatment plan is best for you. STAGES  Stage 0. The cancer is found only in the innermost lining of the colon or rectum.  Stage I. The cancer has grown into the inner wall of the colon or rectum. The cancer has not yet reached the outer wall of the colon.  Stage II. The cancer extends more deeply into or through the wall of the colon or rectum. It may have invaded nearby tissue, but cancer cells have not spread to the lymph nodes.  Stage III. The cancer has spread to nearby lymph nodes but not to other parts of the body.  Stage IV. The cancer has spread to other parts of the body, such as the liver or lungs. Your caregiver may tell you the detailed stage of your cancer. In that case, the stage will include both a number and a letter. TREATMENT  Depending on the type and stage, colorectal cancer may be treated with surgery, radiation therapy, or chemotherapy. Some patients have a combination of these therapies.  Surgery may be done to remove the polyps from your colon. In [place] stages, your caregiver may be able to do this during a colonoscopy. In later stages, surgery may be done to remove part of your colon.  Radiation therapy uses high-energy rays to kill cancer cells. This is usually recommended for patients with rectal cancer.  Chemotherapy is the use of drugs to kill cancer cells. Caregivers also give chemotherapy to help reduce pain and other problems caused by colorectal cancer. This may be done even if the cancer is not curable. HOME CARE INSTRUCTIONS   Only take over-the-counter or prescription medicines for pain, discomfort, or fever as directed by your caregiver.  Maintain a healthy diet.  Consider joining a support group. This may help you learn to cope with the stress of having colorectal cancer.  Seek advice to help you manage treatment side effects.  Keep all follow-up appointments as directed by your caregiver.  Inform your cancer specialist if you are  admitted to the hospital. SEEK IMMEDIATE MEDICAL CARE IF:   Your diarrhea or constipation does not go away.  You have alternating constipation and diarrhea.  You have blood in your stools.  Your abdominal pain gets worse.  You lose weight without trying.  You notice new fatigue or weakness.  You develop a fever during chemotherapy treatment. Document Released: 09/02/2005 Document Revised: 11/25/2011 Document Reviewed: 08/20/2011 [facility] Patient Information 2013 [place], [place].  Pelvic floor muscle training exercises can help strengthen the muscles under the uterus, bladder, and bowel (large intestine). They can help both men and women who have problems with urine leakage or bowel control. A pelvic floor muscle training exercise is like pretending that you have to urinate, and then holding it. You relax and tighten the muscles that control urine flow. It's important to find the right muscles to tighten. The next time you have to urinate, start to go and then stop. Feel the muscles in your vagina, bladder, or anus get tight and move up. These are the pelvic floor muscles. If you feel them tighten, you've done the exercise right. If you are still not  sure whether you are tightening the right muscles, keep in mind that all of the muscles of the pelvic floor relax and contract at the same time. Because these muscles control the bladder, rectum, and vagina, the following tips may help: Women: Insert a finger into your vagina. Tighten the muscles as if you are holding in your urine, then let go. You should feel the muscles tighten and move up and down.  Men: Insert a finger into your rectum. Tighten the muscles as if you are holding in your urine, then let go. You should feel the muscles tighten and move up and down. These are the same muscles you would tighten if you were trying to prevent yourself from passing gas. It is very important that you keep the following muscles relaxed while doing  pelvic floor muscle training exercises: Abdominal  Buttocks (the deeper, anal sphincter muscle should contract)  Thigh  A woman can also strengthen these muscles by using a vaginal cone, which is a weighted device that is inserted into the vagina. Then you try to tighten the pelvic floor muscles to hold the device in place. If you are unsure whether you are doing the pelvic floor muscle training correctly, you can use biofeedback and electrical stimulation to help find the correct muscle group to work. Biofeedback is a method of positive reinforcement. Electrodes are placed on the abdomen and along the anal area. Some therapists place a sensor in the vagina in women or anus in men to monitor the contraction of pelvic floor muscles.  A monitor will display a graph showing which muscles are contracting and which are at rest. The therapist can help find the right muscles for performing pelvic floor muscle training exercises.  PERFORMING PELVIC FLOOR EXERCISES: 1. Begin by emptying your bladder. 2. Tighten the pelvic floor muscles and hold for a count of 10. 3. Relax the muscles completely for a count of 10. 4. Do 10 repititions, 3 to 5 times a day (morning, afternoon, and night). You can do these exercises at any time and any place. Most people prefer to do the exercises while lying down or sitting in a chair. After 4 - 6 weeks, most people notice some improvement. It may take as long as 3 months to see a major change. After a couple of weeks, you can also try doing a single pelvic floor contraction at times when you are likely to leak (for example, while getting out of a chair). A word of caution: Some people feel that they can speed up the progress by increasing the number of repetitions and the frequency of exercises. However, over-exercising can instead cause muscle fatigue and increase urine leakage. If you feel any discomfort in your abdomen or back while doing these exercises, you are probably  doing them wrong. Breathe deeply and relax your body when you are doing these exercises. Make sure you are not tightening your stomach, thigh, buttock, or chest muscles. When done the right way, pelvic floor muscle exercises have been shown to be very effective at improving urinary continence. Alternative Names Kegel exercises

## 2012-08-31 NOTE — Progress Notes (Signed)
Subjective:     Patient ID: Cheryl Holland, female   DOB: 01/07/1961, 51 y.o.   MRN: [id number]  HPI   Cheryl Holland  03-04-1961 [id number]  Patient Care Team: Minda Meo, MD as PCP - General (Internal Medicine) Hilarie Fredrickson, MD as Consulting Physician (Gastroenterology) Rachael Fee, MD as Consulting Physician (Gastroenterology)  This patient is a 51 y.o.female who presents today for surgical evaluation.   Reason for visit: 9 month followup status post partial proctectomy for Ver[place]even rectal cancer Feb2013   REPORT OF SURGICAL PATHOLOGY FINAL DIAGNOSIS Diagnosis Colon, segmental resection for tumor, rectal polyp, proximal, distal, left lateral, right lateral - ULCER WITH FIBROSIS AND INFLAMMATION. - TATTOO PIGMENT. - NO RESIDUAL CARCINOMA OR ADENOMA. - TWO BENIGN LYMPH NODES (0/2). - FINAL MARGINS CLEAR.  Patient underwent endoscopy in August.  Clean.  Small area of granulation at partial proctectomy site.  Biopsy:  REPORT OF SURGICAL PATHOLOGY FINAL DIAGNOSIS Diagnosis Surgical [P], rectum, near prior surgical site - BENIGN REACTIVE / REGENERATIVE COLONIC MUCOSA WITH GRANULATION-TYPE TISSUE SEE COMMENT. - NEGATIVE FOR DYSPLASIA OR MALIGNANCY. Microscopic Comment There are reactive / regenerative epithelial changes associated with polypoid granulation tissue in which there is a foreign body multinucleated giant cell response. The findings are consistent with previous surgical site mucosal injury. There are no diagnostic features of epithelial dysplasia or malignancy. (CRR:caf 05/21/12) [place] RUND DO Pathologist, Electronic Signature(Case signed 05/21/2012)  The patient comes in today feeling well.  Eating well.  Back to exercising in the gym on a daily basis. She will note sometimes she has more urge for flatulence or worry she cannot hold it in very well.  No major leaking.  No accidents.  Some mild fecal urgency but improved.Still has some rectal fullness and mild  urgency, but much improved.  Worried about missing a recurrence or new polyp.No rectal bleeding.  No discharge.  Urinating fine.  No urinary incontinence.   Patient Active Problem List  Diagnosis  . T1sm1 Rectal cancer in proximal rectal polyp s/p polypectomy & f/u partial proctectomy by TEM     Past Medical History  Diagnosis Date  . Gallstones     SEEN ON CT REPORT 10/21/11  . Cyst of right kidney     PER CT REPORT 10/21/11  . Pulmonary nodules     SEEN ON CT REPORT 10/21/11--AND NOTE FROM DR. PERRY THAT PT AND HUSBAND NOTIFIED--PT MAY NEED FOLLOW UP CT CHEST IN 6 MONTHS  . Allergy     shimp  . Rectal cancer in proximal rectal polyp s/p polypectomy 10/18/2011    Past Surgical History  Procedure Date  . Foot ganglion excision 2004 - approximate    right  . Inguinal hernia repair 2001    right  . Eus 10/18/2011    Procedure: LOWER ENDOSCOPIC ULTRASOUND (EUS);  Surgeon: Rob Bunting, MD;  Location: [facility];  Service: Endoscopy;  Laterality: N/A;  tattoo,  moderate sedation, TR  . Proctoscopy 10/30/2011    Procedure: PROCTOSCOPY;  Surgeon: Ardeth Sportsman, MD;  Location: [facility];  Service: General;  Laterality: N/A;  . Partial proctectomy 10/30/2011    TEM resection of polyp base, path = T1sm1    History   Social History  . Marital Status: Married    Spouse Name: N/A    Number of Children: N/A  . Years of Education: N/A   Occupational History  . Not on file.   Social History Main Topics  . Smoking status: Never  Smoker   . Smokeless tobacco: Never Used  . Alcohol Use: 1.2 oz/week    2 Glasses of wine per week  . Drug Use: No  . Sexually Active: Not on file   Other Topics Concern  . Not on file   Social History Narrative   Spouse is Dr. Gretta Began, vascular surgeon    Family History  Problem Relation Age of Onset  . Colon cancer Neg Hx   . Esophageal cancer Neg Hx   . Rectal cancer Neg Hx   . Stomach cancer Neg Hx   . Dementia Father     Current Outpatient  Prescriptions  Medication Sig Dispense Refill  . calcium carbonate (OS-CAL) 600 MG TABS Take 600 mg by mouth as needed.       . cholecalciferol (VITAMIN D) 1000 UNITS tablet Take 1,000 Units by mouth as needed.       Marland Kitchen EPINEPHrine (EPIPEN JR) 0.15 MG/0.3ML injection Inject 0.15 mg into the muscle as needed.      . fish oil-omega-3 fatty acids 1000 MG capsule Take 1 g by mouth as needed.       . furosemide (LASIX) 40 MG tablet Take 40 mg by mouth daily before breakfast.       . ibuprofen (ADVIL,MOTRIN) 200 MG tablet Take 600 mg by mouth as needed. Pain       . Multiple Vitamins-Minerals (MULTIVITAMIN WITH MINERALS) tablet Take 1 tablet by mouth as needed.       . potassium chloride (KLOR-CON) 8 MEQ tablet Take 8 mEq by mouth daily.         Allergies  Allergen Reactions  . Shrimp (Shellfish Allergy) Anaphylaxis, Hives and Swelling    BP 124/72  Pulse 73  Temp 97.8 F (36.6 C) (Temporal)  Ht 5' 2.5" (1.588 m)  Wt 117 lb 3.2 oz (53.162 kg)  BMI 21.09 kg/m2  SpO2 99%  No results found.   Review of Systems  Constitutional: Negative for fever, chills and diaphoresis.  HENT: Negative for ear pain, sore throat and trouble swallowing.   Eyes: Negative for photophobia and visual disturbance.  Respiratory: Negative for cough and choking.   Cardiovascular: Negative for chest pain and palpitations.       Works out at gym 2 hours/day w/o problems  Gastrointestinal: Negative for nausea, vomiting, abdominal pain, diarrhea, constipation, blood in stool, abdominal distention, anal bleeding and rectal pain.  Genitourinary: Negative for dysuria, frequency and difficulty urinating.  Musculoskeletal: Negative for myalgias and gait problem.  Skin: Negative for color change, pallor and rash.  Neurological: Negative for dizziness, speech difficulty, weakness and numbness.  Hematological: Negative for adenopathy.  Psychiatric/Behavioral: Negative for confusion and agitation. The patient is not  nervous/anxious.        Objective:   Physical Exam  Constitutional: She is oriented to person, place, and time. She appears well-developed and well-nourished. No distress.  HENT:  Head: Normocephalic.  Mouth/Throat: Oropharynx is clear and moist. No oropharyngeal exudate.  Eyes: Conjunctivae normal and EOM are normal. Pupils are equal, round, and reactive to light. No scleral icterus.  Neck: Normal range of motion. No tracheal deviation present.  Cardiovascular: Normal rate and intact distal pulses.   Pulmonary/Chest: Effort normal. No respiratory distress. She exhibits no tenderness.  Abdominal: Soft. She exhibits no distension. There is no tenderness. Hernia confirmed negative in the right inguinal area and confirmed negative in the left inguinal area.       Incisions clean with normal healing ridges.  No hernias  Genitourinary: No vaginal discharge found.       Exam done with assistance of female Medical Assistant in the room.  Perianal skin clean with good hygiene.  No pruritis.  No external skin tags / hemorrhoids of significance.  No pilonidal disease.  No fissure.  No abscess/fistula.  Tolerates digital rectal exam & anoscopy.  Normal sphincter tone.  No rectal masses.  Hemorrhoidal piles WNL.    Scar right posterior midline  ~9cm from anal verge.  Smooth.  No mass/ulceration  Musculoskeletal: Normal range of motion. She exhibits no tenderness.  Lymphadenopathy:       Right: No inguinal adenopathy present.       Left: No inguinal adenopathy present.  Neurological: She is alert and oriented to person, place, and time. No cranial nerve deficit. She exhibits normal muscle tone. Coordination normal.  Skin: Skin is warm and dry. No rash noted. She is not diaphoretic.  Psychiatric: She has a normal mood and affect. Her behavior is normal.       Assessment:     Gordan rectal cancer (T1sm1) found within a polyp s/p TEM partial proctectomy with no evidence of local recurrence    Plan:      Surgical follow-up after low rectal cancer resection: -Colonoscopy in Summer 2014 -Rectal exam Spring & Fall 2014 -Rectal exam annually 3rd-5th years  Continue regular activity as tolerated.  Do not push through pain.  Bowel regimen to avoid problems.  She does not like Fiber supplements.  I tried to encourage a high-fiber diet with her.  Consider Kegel pelvic floor exercises to help with tone of pelvic floor.  I noted she has excellent sphincter tone with no evidence of any sphincter weakness or injury, Blood pelvic floor muscle exercises can be helpful if she feels/senses laxity.  The patient expressed understanding and appreciation

## 2013-01-04 ENCOUNTER — Ambulatory Visit (INDEPENDENT_AMBULATORY_CARE_PROVIDER_SITE_OTHER): Payer: Commercial Managed Care - PPO | Admitting: Surgery

## 2013-01-07 ENCOUNTER — Ambulatory Visit (INDEPENDENT_AMBULATORY_CARE_PROVIDER_SITE_OTHER): Payer: 59 | Admitting: Cardiology

## 2013-01-07 ENCOUNTER — Institutional Professional Consult (permissible substitution): Payer: Self-pay | Admitting: Cardiology

## 2013-01-07 ENCOUNTER — Encounter: Payer: Self-pay | Admitting: Cardiology

## 2013-01-07 VITALS — BP 112/62 | HR 66 | Ht 62.5 in | Wt 119.8 lb

## 2013-01-07 DIAGNOSIS — R002 Palpitations: Secondary | ICD-10-CM

## 2013-01-07 LAB — BASIC METABOLIC PANEL
BUN: 21 mg/dL (ref 6–23)
CO2: 30 mEq/L (ref 19–32)
Calcium: 9.3 mg/dL (ref 8.4–10.5)
Chloride: 99 mEq/L (ref 96–112)
Creatinine, Ser: 1 mg/dL (ref 0.4–1.2)
GFR: 65.45 mL/min (ref >60.00)
Glucose, Bld: 86 mg/dL (ref 70–99)
Potassium: 3.3 mEq/L — ABNORMAL LOW (ref 3.5–5.1)
Sodium: 138 mEq/L (ref 135–145)

## 2013-01-07 LAB — MAGNESIUM: Magnesium: 2 mg/dL (ref 1.5–2.5)

## 2013-01-07 LAB — TSH: TSH: 0.48 u[IU]/mL (ref 0.35–5.50)

## 2013-01-07 NOTE — Progress Notes (Signed)
Cheryl Holland Date of Birth: [birth year] Medical Record #[id number]  History of Present Illness: Cheryl Holland is seen at the request of her husband Dr. Tawanna Cooler Hinchliffe for evaluation of palpitations. She is a pleasant 52 year old white female who has been in good health. Approximately 5 weeks ago she began experiencing constant palpitations. She felt this as a skip beat that was noticeable in her chest and when she took her pulse. It would occur throughout the day. It lasted for 3 weeks. She has been taking Lasix chronically for swelling. She had not been taking her potassium regularly and when she resumed her potassium supplement her palpitations improved significantly. She states that when she had her palpitations it took her breath away. She denies any dizziness or syncope. She is a self-described exercise fanatic and often does vigorous exercise twice a day. She has noticed no limitation with activity even when she is having palpitations. She does drink at least 3 cups of coffee daily.  Current Outpatient Prescriptions on File Prior to Visit  Medication Sig Dispense Refill  . calcium carbonate (OS-CAL) 600 MG TABS Take 600 mg by mouth as needed.       Marland Kitchen EPINEPHrine (EPIPEN JR) 0.15 MG/0.3ML injection Inject 0.15 mg into the muscle as needed.      . furosemide (LASIX) 40 MG tablet Take 40 mg by mouth daily before breakfast.       . potassium chloride (KLOR-CON) 8 MEQ tablet Take 20 mEq by mouth daily.        No current facility-administered medications on file prior to visit.    Allergies  Allergen Reactions  . Shrimp (Shellfish Allergy) Anaphylaxis, Hives and Swelling    Past Medical History  Diagnosis Date  . Gallstones     SEEN ON CT REPORT 10/21/11  . Cyst of right kidney     PER CT REPORT 10/21/11  . Pulmonary nodules     SEEN ON CT REPORT 10/21/11--AND NOTE FROM DR. PERRY THAT PT AND HUSBAND NOTIFIED--PT MAY NEED FOLLOW UP CT CHEST IN 6 MONTHS  . Allergy     shrimp  . Rectal cancer in  proximal rectal polyp s/p polypectomy 10/18/2011    Past Surgical History  Procedure Laterality Date  . Foot ganglion excision  2004 - approximate    right  . Inguinal hernia repair  2001    right  . Eus  10/18/2011    Procedure: LOWER ENDOSCOPIC ULTRASOUND (EUS);  Surgeon: Rob Bunting, MD;  Location: [facility];  Service: Endoscopy;  Laterality: N/A;  tattoo,  moderate sedation, TR  . Proctoscopy  10/30/2011    Procedure: PROCTOSCOPY;  Surgeon: Ardeth Sportsman, MD;  Location: [facility];  Service: General;  Laterality: N/A;  . Partial proctectomy  10/30/2011    TEM resection of polyp base, path = T1sm1    History  Smoking status  . Never Smoker   Smokeless tobacco  . Never Used    History  Alcohol Use  . 1.2 oz/week  . 2 Glasses of wine per week    Family History  Problem Relation Age of Onset  . Colon cancer Neg Hx   . Esophageal cancer Neg Hx   . Rectal cancer Neg Hx   . Stomach cancer Neg Hx   . Dementia Father     Review of Systems: The review of systems is positive for history of rectal cancer with a polyp that was removed. Surgical margins were clear.  All other systems were reviewed and  are negative.  Physical Exam: BP 112/62  Pulse 66  Ht 5' 2.5" (1.588 m)  Wt 119 lb 12.8 oz (54.341 kg)  BMI 21.55 kg/m2 She is a pleasant white female in no acute distress. HEENT: Normal. No thyromegaly. No JVD or bruits. Lungs: Clear Cardiovascular: Regular rate and rhythm, normal S1 and S2. No gallop, murmur, or rub. Abdomen: Soft and nontender. No masses or bruits. No pedis thyromegaly. Extremities: Radial and pedal pulses are 2+. No edema. Skin: Warm and dry Neuro: Nonfocal.  LABORATORY DATA: Lab Results  Component Value Date   WBC 10.9* 10/31/2011   HGB 11.7* 10/31/2011   HCT 34.1* 10/31/2011   PLT 250 10/31/2011   GLUCOSE 86 01/07/2013   ALT 25 10/17/2011   AST 24 10/17/2011   NA 138 01/07/2013   K 3.3* 01/07/2013   CL 99 01/07/2013   CREATININE 1.0 01/07/2013   BUN  21 01/07/2013   CO2 30 01/07/2013   TSH 0.48 01/07/2013   INR 1.0 10/17/2011   ECG today demonstrates normal sinus rhythm with a rate of 67 beats per minute. It is normal.  Assessment / Plan: 1. Palpitations. I think this was related to hypokalemia and caffeine. She has a normal cardiac exam and ECG. Symptoms have improved since she has resumed her potassium supplement. I recommended reduction in caffeine intake. She is to continue her potassium supplement. I would try to reduce her Lasix to 20 mg per day. At this point I do not feel that she needs any further cardiac evaluation. She is to contact me if she has worsening palpitations.

## 2013-01-07 NOTE — Patient Instructions (Signed)
Take your potassium   We will check lab work today

## 2013-01-12 ENCOUNTER — Ambulatory Visit (INDEPENDENT_AMBULATORY_CARE_PROVIDER_SITE_OTHER): Payer: Commercial Managed Care - PPO | Admitting: Surgery

## 2013-01-13 ENCOUNTER — Ambulatory Visit (INDEPENDENT_AMBULATORY_CARE_PROVIDER_SITE_OTHER): Payer: Commercial Managed Care - PPO | Admitting: Surgery

## 2013-01-13 ENCOUNTER — Encounter (INDEPENDENT_AMBULATORY_CARE_PROVIDER_SITE_OTHER): Payer: Self-pay | Admitting: Surgery

## 2013-01-13 VITALS — BP 112/82 | HR 60 | Temp 99.0°F | Resp 12 | Ht 62.5 in | Wt 117.8 lb

## 2013-01-13 DIAGNOSIS — C20 Malignant neoplasm of rectum: Secondary | ICD-10-CM

## 2013-01-13 NOTE — Patient Instructions (Addendum)
GETTING TO GOOD BOWEL HEALTH. Irregular bowel habits such as constipation and diarrhea can lead to many problems over time.  Having one soft bowel movement a day is the most important way to prevent further problems.  The anorectal canal is designed to handle stretching and feces to safely manage our ability to get rid of solid waste (feces, poop, stool) out of our body.  BUT, hard constipated stools can act like ripping concrete bricks and diarrhea can be a burning fire to this very sensitive area of our body, causing inflamed hemorrhoids, anal fissures, increasing risk is perirectal abscesses, abdominal pain/bloating, an making irritable bowel worse.     The goal: ONE SOFT BOWEL MOVEMENT A DAY!  To have soft, regular bowel movements:    Drink at least 8 tall glasses of water a day.     Take plenty of fiber.  Fiber is the undigested part of plant food that passes into the colon, acting s "natures broom" to encourage bowel motility and movement.  Fiber can absorb and hold large amounts of water. This results in a larger, bulkier stool, which is soft and easier to pass. Work gradually over several weeks up to 6 servings a day of fiber (25g a day even more if needed) in the form of: o Vegetables -- Root (potatoes, carrots, turnips), leafy green (lettuce, salad greens, celery, spinach), or cooked high residue (cabbage, broccoli, etc) o Fruit -- Fresh (unpeeled skin & pulp), Dried (prunes, apricots, cherries, etc ),  or stewed ( applesauce)  o Whole grain breads, pasta, etc (whole wheat)  o Bran cereals    Bulking Agents -- This type of water-retaining fiber generally is easily obtained each day by one of the following:  o Psyllium bran -- The psyllium plant is remarkable because its ground seeds can retain so much water. This product is available as Metamucil, Konsyl, Effersyllium, Per Diem Fiber, or the less expensive generic preparation in drug and health food stores. Although labeled a laxative, it really  is not a laxative.  o Methylcellulose -- This is another fiber derived from wood which also retains water. It is available as Citrucel. o Polyethylene Glycol - and "artificial" fiber commonly called Miralax or Glycolax.  It is helpful for people with gassy or bloated feelings with regular fiber o Flax Seed - a less gassy fiber than psyllium   No reading or other relaxing activity while on the toilet. If bowel movements take longer than 5 minutes, you are too constipated   AVOID CONSTIPATION.  High fiber and water intake usually takes care of this.  Sometimes a laxative is needed to stimulate more frequent bowel movements, but    Laxatives are not a good long-term solution as it can wear the colon out. o Osmotics (Milk of Magnesia, Fleets phosphosoda, Magnesium citrate, MiraLax, GoLytely) are safer than  o Stimulants (Senokot, Castor Oil, Dulcolax, Ex Lax)    o Do not take laxatives for more than 7days in a row.    IF SEVERELY CONSTIPATED, try a Bowel Retraining Program: o Do not use laxatives.  o Eat a diet high in roughage, such as bran cereals and leafy vegetables.  o Drink six (6) ounces of prune or apricot juice each morning.  o Eat two (2) large servings of stewed fruit each day.  o Take one (1) heaping tablespoon of a psyllium-based bulking agent twice a day. Use sugar-free sweetener when possible to avoid excessive calories.  o Eat a normal breakfast.  o   Set aside 15 minutes after breakfast to sit on the toilet, but do not strain to have a bowel movement.  o If you do not have a bowel movement by the third day, use an enema and repeat the above steps.    Controlling diarrhea o Switch to liquids and simpler foods for a few days to avoid stressing your intestines further. o Avoid dairy products (especially milk & ice cream) for a short time.  The intestines often can lose the ability to digest lactose when stressed. o Avoid foods that cause gassiness or bloating.  Typical foods include  beans and other legumes, cabbage, broccoli, and dairy foods.  Every person has some sensitivity to other foods, so listen to our body and avoid those foods that trigger problems for you. o Adding fiber (Citrucel, Metamucil, psyllium, Miralax) gradually can help thicken stools by absorbing excess fluid and retrain the intestines to act more normally.  Slowly increase the dose over a few weeks.  Too much fiber too soon can backfire and cause cramping & bloating. o Probiotics (such as active yogurt, Align, etc) may help repopulate the intestines and colon with normal bacteria and calm down a sensitive digestive tract.  Most studies show it to be of mild help, though, and such products can be costly. o Medicines:   Bismuth subsalicylate (ex. Kayopectate, Pepto Bismol) every 30 minutes for up to 6 doses can help control diarrhea.  Avoid if pregnant.   Loperamide (Immodium) can slow down diarrhea.  Start with two tablets (4mg  total) first and then try one tablet every 6 hours.  Avoid if you are having fevers or severe pain.  If you are not better or start feeling worse, stop all medicines and call your doctor for advice o Call your doctor if you are getting worse or not better.  Sometimes further testing (cultures, endoscopy, X-ray studies, bloodwork, etc) may be needed to help diagnose and treat the cause of the diarrhea.  Colorectal Cancer Colorectal cancer is an abnormal growth of tissue (tumor) in the colon or rectum that is cancerous (malignant). Unlike noncancerous (benign) tumors, malignant tumors can spread to other parts of your body. The colon is the large bowel or large intestine. The rectum is the last several inches of the colon. CAUSES  The exact cause of colon cancer is unknown.  RISK FACTORS The majority of patients do not have identifiable risk factors, but the following factors may increase your chances of getting colon cancer:  Age. Most colorectal cancers occur in people older than 50  years.  Having abnormal growths (polyps) on the inner wall of the colon or rectum.  Diabetes.  Being African American.  Family history of hereditary nonpolyposis colon cancer. This condition is caused by changes in the genes that are responsible for repairing mismatched DNA.  Family history of familial adenomatous polyposis (FAP). This is a rare, inherited condition in which hundreds of polyps form in the colon and rectum. It is caused by a change in the APC gene. Unless FAP is treated, it usually leads to colorectal cancer by age 12.  Personal history of cancer. A person who has already had colorectal cancer may develop it a second time. Also, women with a history of ovarian, uterine, or breast cancer are at a somewhat higher risk of developing colorectal cancer.  Inflammatory bowel disease, including ulcerative colitis and Crohn's disease.  Being obese or eating a diet that is high in fat (especially animal fat) and low in fiber,  fruits, and vegetables.  Smoking. A person who smokes cigarettes may be at increased risk of developing polyps and colorectal cancer.  Heavy alcohol use. SYMPTOMS Burak colorectal cancer often does not cause symptoms. As the cancer grows, symptoms may include:  Diarrhea.  Constipation.  Feeling like the bowel does not empty completely after a bowel movement.  Blood in the stool.  Stools that are narrower than usual.  Abdominal discomfort, pain, bloating, fullness, or cramps.  Unexplained weight loss.  Constant tiredness.  Nausea and vomiting. DIAGNOSIS  Your caregiver will ask about your medical history. He or she may also perform a number of procedures, such as:  A physical exam.  Blood tests. This may include a routine complete blood count and iron level testing. Your caregiver may also check for carcinoembryonic antigen (CEA) and other substances in the blood. Some people who have colorectal cancer have a high CEA level. These levels may be  used to follow the activity of your colon cancer.  Chest X-rays, computed tomography (CT) scans, or magnetic resonance imaging (MRI).  Taking a tissue sample (biopsy) from the colon or rectum. The sample is examined under a microscope to look for cancer cells.  Sigmoidoscopy. With this test, your caregiver can see inside your colon. A thin flexible tube (sigmoidoscope) is placed into your rectum. This device has a light source and a tiny video camera in it. Your caregiver uses the sigmoidoscope to look at the last third of your colon.  Colonoscopy. This test is like sigmoidoscopy, but your caregiver looks at the entire colon. This test usually requires medicine that helps you relax (sedative).  Endorectal ultrasound. With this test, your caregiver can see how deep a rectal tumor has grown and whether the cancer has spread to lymph nodes or other nearby tissues. A tool (probe) is inserted into the rectum. The probe sends out sound waves to the rectum and nearby tissues, and a computer uses the echoes to create a picture. Your cancer will be staged to determine its severity and extent. Staging is a careful attempt to find out the size of the tumor, whether the cancer has spread, and if so, to what parts of the body. You may need to have more tests to determine the stage of your cancer. The test results will help determine what treatment plan is best for you. STAGES  Stage 0. The cancer is found only in the innermost lining of the colon or rectum.  Stage I. The cancer has grown into the inner wall of the colon or rectum. The cancer has not yet reached the outer wall of the colon.  Stage II. The cancer extends more deeply into or through the wall of the colon or rectum. It may have invaded nearby tissue, but cancer cells have not spread to the lymph nodes.  Stage III. The cancer has spread to nearby lymph nodes but not to other parts of the body.  Stage IV. The cancer has spread to other parts of  the body, such as the liver or lungs. Your caregiver may tell you the detailed stage of your cancer. In that case, the stage will include both a number and a letter. TREATMENT  Depending on the type and stage, colorectal cancer may be treated with surgery, radiation therapy, or chemotherapy. Some patients have a combination of these therapies.  Surgery may be done to remove the polyps from your colon. In [place] stages, your caregiver may be able to do this during a colonoscopy.  In later stages, surgery may be done to remove part of your colon.  Radiation therapy uses high-energy rays to kill cancer cells. This is usually recommended for patients with rectal cancer.  Chemotherapy is the use of drugs to kill cancer cells. Caregivers also give chemotherapy to help reduce pain and other problems caused by colorectal cancer. This may be done even if the cancer is not curable. HOME CARE INSTRUCTIONS   Only take over-the-counter or prescription medicines for pain, discomfort, or fever as directed by your caregiver.  Maintain a healthy diet.  Consider joining a support group. This may help you learn to cope with the stress of having colorectal cancer.  Seek advice to help you manage treatment side effects.  Keep all follow-up appointments as directed by your caregiver.  Inform your cancer specialist if you are admitted to the hospital. SEEK IMMEDIATE MEDICAL CARE IF:   Your diarrhea or constipation does not go away.  You have alternating constipation and diarrhea.  You have blood in your stools.  Your abdominal pain gets worse.  You lose weight without trying.  You notice new fatigue or weakness.  You develop a fever during chemotherapy treatment. Document Released: 09/02/2005 Document Revised: 11/25/2011 Document Reviewed: 08/20/2011 [facility] Patient Information 2013 [place], [place].

## 2013-01-13 NOTE — Progress Notes (Signed)
Subjective:     Patient ID: Cheryl Holland, female   DOB: 04/27/1961, 52 y.o.   MRN: [id number]  HPI   Cheryl Holland  [birth year] [id number]  Patient Care Team: Minda Meo, MD as PCP - General (Internal Medicine) Hilarie Fredrickson, MD as Consulting Physician (Gastroenterology) Rachael Fee, MD as Consulting Physician (Gastroenterology)  This patient is a 52 y.o.female who presents today for surgical evaluation.   Reason for visit: Followup status post partial proctectomy for Kendrick rectal cancer 10/30/2011   REPORT OF SURGICAL PATHOLOGY FINAL DIAGNOSIS Diagnosis Colon, segmental resection for tumor, rectal polyp, proximal, distal, left lateral, right lateral - ULCER WITH FIBROSIS AND INFLAMMATION. - TATTOO PIGMENT. - NO RESIDUAL CARCINOMA OR ADENOMA. - TWO BENIGN LYMPH NODES (0/2). - FINAL MARGINS CLEAR.  INITIAL PATH at polypectomy:  REASON FOR ADDENDUM, AMENDMENT OR CORRECTION: [id number]: part 3: Minor typographic discrepancy of reported size of invasive carcinoma in primary diagnosis, with size of invasive carcinoma in oncology template. Oncology template invasive tumor size changed from 0.9 to 0.7 cm to match primary diagnostic line. No other changes from original report. (RAH:eps 11/04/11) ADDITIONAL INFORMATION: 3. Tissue block ([id number]) sent to [facility] for microsatellite instability PCR testing ([id number]). Result of microsatellite instability by PCR: No evidence of microsatellite instability. Please refer to full [facility] report ([id number]). (RAH:eps 11/06/11) Zandra Abts MD Pathologist, Electronic Signature ( Signed 08/16/2012) FINAL DIAGNOSIS Diagnosis 1. Cecum, polyp x 2 - TUBULAR ADENOMA (2 FRAGMENTS). - NO HIGH GRADE DYSPLASIA OR MALIGNANCY IDENTIFIED. 2. Colon, polyp(s), descending (multiple pieces) - HYPERPLASTIC POLYP (2 FRAGMENTS). - NO DYSPLASIA OR MALIGNANCY IDENTIFIED. 3. Rectum, polyp(s), (multiple pieces) - FRAGMENTS OF  TUBULAR ADENOMA WITH 0.7 CM FOCUS OF INVASIVE ADENOCARCINOMA CLOSELY APPROACHING CAUTERIZED EDGE (<0.1 CM). - HIGH GRADE DYSPLASIA PRESENT MEASURING AT LEAST 1.1 CM IN GREATEST DIMENSION. - LYMPH / VASCULAR INVASION NOT IDENTIFIED. - SEE ONCOLOGY TEMPLATE (ESPECIALLY MARGIN DESCRIPTION) AND COMMENT. 1 of 2 Amended copy Addendum FINAL for Nakamura, Tamerra R ([id number]) Microscopic Comment 3. ONCOLOGY TABLE - POLYPECTOMY Polyp/tumor site: Rectum. Polyp size: 1.7 cm in greatest dimension. Polyp type: Pedunculated. Invasive tumor: Size: 0.7 cm. Histologic type: Adenocarcinoma. Histologic grade and differentiation: G1: well differentiated/low grade. Microscopic tumor extension: Tumor invades stalk. Margins: The specimen is received fragmented and margin status is not possible to entirely evaluate; However, invasive adenocarcinoma closely approaches (<0.1 cm) cauterized edge. Of note, there is a greater distance between invasive tumor nests than there is between the last tumor nest and the cauterized edge. In short, assurance of negative margin status is not possible. Lymphovascular invasion: Not identified. Additional non-dysplastic and non-neoplastic findings: None. Ancillary studies: Can be performed upon request. Comment: Sections show a tubular adenoma with high grade dysplasia and a focus of invasive adenocarcinoma. Dr. Frederica Kuster has seen the rectal polyp in consultation with agreement of the above diagnosis. (RAH:eps 10/16/11) Zandra Abts MD Pathologist, Electronic Signature (Case signed 11/04/2011)   The patient comes in today feeling well.  Eating well.  Exercising in the gym on a daily basis.  No prob w incontinence/leaking.  No accidents.  No rectal bleeding.  No discharge.  Urinating fine.  No urinary incontinence.   Patient Active Problem List   Diagnosis Date Noted  . T1sm1 Rectal cancer in proximal rectal polyp s/p polypectomy & f/u partial proctectomy by TEM 10/30/2011   10/18/2011    Priority: High  . Palpitations 01/07/2013    Past Medical History  Diagnosis Date  . Gallstones  SEEN ON CT REPORT 10/21/11  . Cyst of right kidney     PER CT REPORT 10/21/11  . Pulmonary nodules     SEEN ON CT REPORT 10/21/11--AND NOTE FROM DR. PERRY THAT PT AND HUSBAND NOTIFIED--PT MAY NEED FOLLOW UP CT CHEST IN 6 MONTHS  . Allergy     shrimp  . Rectal cancer in proximal rectal polyp s/p polypectomy 10/18/2011    Past Surgical History  Procedure Laterality Date  . Foot ganglion excision  2004 - approximate    right  . Inguinal hernia repair  2001    right  . Eus  10/18/2011    Procedure: LOWER ENDOSCOPIC ULTRASOUND (EUS);  Surgeon: Rob Bunting, MD;  Location: [facility];  Service: Endoscopy;  Laterality: N/A;  tattoo,  moderate sedation, TR  . Proctoscopy  10/30/2011    Procedure: PROCTOSCOPY;  Surgeon: Ardeth Sportsman, MD;  Location: [facility];  Service: General;  Laterality: N/A;  . Partial proctectomy  10/30/2011    TEM resection of polyp base, path = T1sm1    History   Social History  . Marital Status: Married    Spouse Name: N/A    Number of Children: N/A  . Years of Education: N/A   Occupational History  . Not on file.   Social History Main Topics  . Smoking status: Never Smoker   . Smokeless tobacco: Never Used  . Alcohol Use: 1.2 oz/week    2 Glasses of wine per week  . Drug Use: No  . Sexually Active: Not on file   Other Topics Concern  . Not on file   Social History Narrative   Spouse is Dr. Gretta Began, vascular surgeon    Family History  Problem Relation Age of Onset  . Colon cancer Neg Hx   . Esophageal cancer Neg Hx   . Rectal cancer Neg Hx   . Stomach cancer Neg Hx   . Dementia Father     Current Outpatient Prescriptions  Medication Sig Dispense Refill  . calcium carbonate (OS-CAL) 600 MG TABS Take 600 mg by mouth as needed.       Marland Kitchen EPINEPHrine (EPIPEN JR) 0.15 MG/0.3ML injection Inject 0.15 mg into the muscle as needed.       . furosemide (LASIX) 40 MG tablet Take 40 mg by mouth daily before breakfast.       . potassium chloride (KLOR-CON) 8 MEQ tablet Take 20 mEq by mouth daily.        No current facility-administered medications for this visit.     Allergies  Allergen Reactions  . Shrimp (Shellfish Allergy) Anaphylaxis, Hives and Swelling    BP 112/82  Pulse 60  Temp(Src) 99 F (37.2 C) (Oral)  Resp 12  Ht 5' 2.5" (1.588 m)  Wt 117 lb 12.8 oz (53.434 kg)  BMI 21.19 kg/m2  No results found.   Review of Systems  Constitutional: Negative for fever, chills and diaphoresis.  HENT: Negative for ear pain, sore throat and trouble swallowing.   Eyes: Negative for photophobia and visual disturbance.  Respiratory: Negative for cough and choking.   Cardiovascular: Negative for chest pain and palpitations.       Works out at gym 2 hours/day w/o problems  Gastrointestinal: Negative for nausea, vomiting, abdominal pain, diarrhea, constipation, blood in stool, abdominal distention, anal bleeding and rectal pain.  Genitourinary: Negative for dysuria, frequency and difficulty urinating.  Musculoskeletal: Negative for myalgias and gait problem.  Skin: Negative for color change, pallor and  rash.  Neurological: Negative for dizziness, speech difficulty, weakness and numbness.  Hematological: Negative for adenopathy.  Psychiatric/Behavioral: Negative for confusion and agitation. The patient is not nervous/anxious.        Objective:   Physical Exam  Constitutional: She is oriented to person, place, and time. She appears well-developed and well-nourished. No distress.  HENT:  Head: Normocephalic.  Mouth/Throat: Oropharynx is clear and moist. No oropharyngeal exudate.  Eyes: Conjunctivae and EOM are normal. Pupils are equal, round, and reactive to light. No scleral icterus.  Neck: Normal range of motion. No tracheal deviation present.  Cardiovascular: Normal rate and intact distal pulses.   Pulmonary/Chest:  Effort normal. No respiratory distress. She exhibits no tenderness.  Abdominal: Soft. She exhibits no distension. There is no tenderness. Hernia confirmed negative in the right inguinal area and confirmed negative in the left inguinal area.  Incisions clean with normal healing ridges.  No hernias  Genitourinary: No vaginal discharge found.  Exam done with assistance of female Medical Assistant in the room.  Perianal skin clean with good hygiene.  No pruritis.  No external skin tags / hemorrhoids of significance.  No pilonidal disease.  No fissure.  No abscess/fistula.  Tolerates digital rectal exam & anoscopy.  Normal sphincter tone.  No rectal masses.  Hemorrhoidal piles WNL.    Scar right posterior midline  ~9cm from anal verge.  Smooth.  No mass/ulceration  Musculoskeletal: Normal range of motion. She exhibits no tenderness.  Lymphadenopathy:       Right: No inguinal adenopathy present.       Left: No inguinal adenopathy present.  Neurological: She is alert and oriented to person, place, and time. No cranial nerve deficit. She exhibits normal muscle tone. Coordination normal.  Skin: Skin is warm and dry. No rash noted. She is not diaphoretic.  Psychiatric: She has a normal mood and affect. Her behavior is normal.       Assessment:     Levan rectal cancer (T1sm1) found within a polyp s/p TEM partial proctectomy with no evidence of local recurrence.    Plan:     Surgical follow-up after low rectal cancer resection: -Colonoscopy in Summer 2014 per Dr. Marina Goodell -Rectal exam Fall 2014 -Rectal exam annually 3rd-5th years  Continue regular activity as tolerated.  Do not push through pain.  Bowel regimen to avoid problems.    The patient expressed understanding and appreciation

## 2013-02-12 ENCOUNTER — Other Ambulatory Visit: Payer: Self-pay | Admitting: Oncology

## 2013-02-12 DIAGNOSIS — C20 Malignant neoplasm of rectum: Secondary | ICD-10-CM

## 2013-02-17 ENCOUNTER — Ambulatory Visit (HOSPITAL_COMMUNITY)
Admission: RE | Admit: 2013-02-17 | Discharge: 2013-02-17 | Disposition: A | Discharge status: Home / Self Care | Payer: 59 | Source: Ambulatory Visit | Attending: Oncology | Admitting: Oncology

## 2013-02-17 ENCOUNTER — Encounter (HOSPITAL_COMMUNITY): Payer: Self-pay

## 2013-02-17 DIAGNOSIS — Z85048 Personal history of other malignant neoplasm of rectum, rectosigmoid junction, and anus: Secondary | ICD-10-CM | POA: Insufficient documentation

## 2013-02-17 DIAGNOSIS — R918 Other nonspecific abnormal finding of lung field: Secondary | ICD-10-CM | POA: Insufficient documentation

## 2013-02-17 DIAGNOSIS — C20 Malignant neoplasm of rectum: Secondary | ICD-10-CM

## 2013-02-19 ENCOUNTER — Telehealth: Payer: Self-pay | Admitting: *Deleted

## 2013-02-19 NOTE — Telephone Encounter (Signed)
Received vm call from pt requesting report of CT scan done wed b/c she is going out of town this weekend.  Call back # is [phone].

## 2013-04-27 ENCOUNTER — Encounter: Payer: Self-pay | Admitting: Internal Medicine

## 2013-04-28 ENCOUNTER — Ambulatory Visit (AMBULATORY_SURGERY_CENTER): Payer: 59 | Admitting: *Deleted

## 2013-04-28 VITALS — Ht 62.5 in | Wt 122.0 lb

## 2013-04-28 DIAGNOSIS — C20 Malignant neoplasm of rectum: Secondary | ICD-10-CM

## 2013-04-28 MED ORDER — MOVIPREP 100 G PO SOLR: ORAL | Status: DC

## 2013-04-28 NOTE — Progress Notes (Signed)
Patient denies any allergies to eggs or soy. Patient denies any problems with anesthesia.  

## 2013-05-11 ENCOUNTER — Encounter: Payer: Self-pay | Admitting: Internal Medicine

## 2013-05-11 ENCOUNTER — Ambulatory Visit (AMBULATORY_SURGERY_CENTER): Payer: 59 | Admitting: Internal Medicine

## 2013-05-11 VITALS — BP 111/64 | HR 67 | Temp 97.6°F | Resp 11 | Ht 62.0 in | Wt 122.0 lb

## 2013-05-11 DIAGNOSIS — D126 Benign neoplasm of colon, unspecified: Secondary | ICD-10-CM

## 2013-05-11 DIAGNOSIS — Z85038 Personal history of other malignant neoplasm of large intestine: Secondary | ICD-10-CM

## 2013-05-11 DIAGNOSIS — C20 Malignant neoplasm of rectum: Secondary | ICD-10-CM

## 2013-05-11 DIAGNOSIS — Z1211 Encounter for screening for malignant neoplasm of colon: Secondary | ICD-10-CM

## 2013-05-11 DIAGNOSIS — D128 Benign neoplasm of rectum: Secondary | ICD-10-CM

## 2013-05-11 MED ORDER — SODIUM CHLORIDE 0.9 % IV SOLN: 500.0000 mL | INTRAVENOUS | Status: DC

## 2013-05-11 NOTE — Progress Notes (Signed)
Called to room to assist during endoscopic procedure.  Patient ID and intended procedure confirmed with present staff. Received instructions for my participation in the procedure from the performing physician.  

## 2013-05-11 NOTE — Progress Notes (Signed)
Patient did not experience any of the following events: a burn prior to discharge; a fall within the facility; wrong site/side/patient/procedure/implant event; or a hospital transfer or hospital admission upon discharge from the facility. (G8907) Patient did not have preoperative order for IV antibiotic SSI prophylaxis. (G8918)  

## 2013-05-11 NOTE — Patient Instructions (Addendum)
YOU HAD AN ENDOSCOPIC PROCEDURE TODAY AT THE [facility]: Refer to the procedure report that was given to you for any specific questions about what was found during the examination.  If the procedure report does not answer your questions, please call your gastroenterologist to clarify.  If you requested that your care partner not be given the details of your procedure findings, then the procedure report has been included in a sealed envelope for you to review at your convenience later.  YOU SHOULD EXPECT: Some feelings of bloating in the abdomen. Passage of more gas than usual.  Walking can help get rid of the air that was put into your GI tract during the procedure and reduce the bloating. If you had a lower endoscopy (such as a colonoscopy or flexible sigmoidoscopy) you may notice spotting of blood in your stool or on the toilet paper. If you underwent a bowel prep for your procedure, then you may not have a normal bowel movement for a few days.  DIET: Your first meal following the procedure should be a light meal and then it is ok to progress to your normal diet.  A half-sandwich or bowl of soup is an example of a good first meal.  Heavy or fried foods are harder to digest and may make you feel nauseous or bloated.  Likewise meals heavy in dairy and vegetables can cause extra gas to form and this can also increase the bloating.  Drink plenty of fluids but you should avoid alcoholic beverages for 24 hours.  ACTIVITY: Your care partner should take you home directly after the procedure.  You should plan to take it easy, moving slowly for the rest of the day.  You can resume normal activity the day after the procedure however you should NOT DRIVE or use heavy machinery for 24 hours (because of the sedation medicines used during the test).    SYMPTOMS TO REPORT IMMEDIATELY: A gastroenterologist can be reached at any hour.  During normal business hours, 8:30 AM to 5:00 PM Monday through Friday,  call [phone].  After hours and on weekends, please call the GI answering service at [phone] who will take a message and have the physician on call contact you.   Following lower endoscopy (colonoscopy or flexible sigmoidoscopy):  Excessive amounts of blood in the stool  Significant tenderness or worsening of abdominal pains  Swelling of the abdomen that is new, acute  Fever of 100F or higher  FOLLOW UP: If any biopsies were taken you will be contacted by phone or by letter within the next 1-3 weeks.  Call your gastroenterologist if you have not heard about the biopsies in 3 weeks.  Our staff will call the home number listed on your records the next business day following your procedure to check on you and address any questions or concerns that you may have at that time regarding the information given to you following your procedure. This is a courtesy call and so if there is no answer at the home number and we have not heard from you through the emergency physician on call, we will assume that you have returned to your regular daily activities without incident.  SIGNATURES/CONFIDENTIALITY: You and/or your care partner have signed paperwork which will be entered into your electronic medical record.  These signatures attest to the fact that that the information above on your After Visit Summary has been reviewed and is understood.  Full responsibility of the confidentiality of this   discharge information lies with you and/or your care-partner.    Resume medications. Information given on polyps with discharge instructions. 

## 2013-05-11 NOTE — Op Note (Signed)
[facility] [address] [place], [address]   COLONOSCOPY PROCEDURE REPORT  PATIENT: Cheryl Holland, Cheryl Holland  MR#: [id number] BIRTHDATE: 1960/12/28 , 52  yrs. old GENDER: Female ENDOSCOPIST: Roxy Cedar, MD REFERRED [id number] Program Recall PROCEDURE DATE:  05/11/2013 PROCEDURE:   Colonoscopy with snare polypectomy x 2 First Screening Colonoscopy - Avg.  risk and is 50 yrs.  old or older - No.  Prior Negative Screening - Now for repeat screening. N/A  History of Adenoma - Now for follow-up colonoscopy & has been > or = to 3 yrs.  N/A  Polyps Removed Today? Yes. ASA CLASS:   Class I INDICATIONS:High risk patient with personal history of rectal cancer (T1 sm1 polyp 09-2011 s/p LAR); f/u 04-2012 negative. MEDICATIONS: MAC sedation, administered by CRNA and propofol (Diprivan) 600mg  IV  DESCRIPTION OF PROCEDURE:   After the risks benefits and alternatives of the procedure were thoroughly explained, informed consent was obtained.  A digital rectal exam revealed no abnormalities of the rectum.   The LB [id number]  endoscope was introduced through the anus and advanced to the cecum, which was identified by both the appendix and ileocecal valve. No adverse events experienced.   The quality of the prep was excellent, using MoviPrep  The instrument was then slowly withdrawn as the colon was fully examined.      COLON FINDINGS: Two diminutive polyps were found in the transverse colon and rectum.  A polypectomy was performed with a cold snare. The resection was complete and the polyp tissue was completely retrieved.   The colon mucosa was otherwise normal.  Retroflexed views revealed internal hemorrhoids. The time to cecum=5 minutes 22 seconds.  Withdrawal time=12 minutes 40 seconds.  The scope was withdrawn and the procedure completed. COMPLICATIONS: There were no complications.  ENDOSCOPIC IMPRESSION: 1.   Two diminutive polyps were found in the transverse  colon and rectum; polypectomy was performed with a cold snare 2.   The colon mucosa was otherwise normal  RECOMMENDATIONS: 1. Repeat Colonoscopy in 3 years.   eSigned:  Roxy Cedar, MD 05/11/2013 11:31 AM   cc: Geoffry Paradise, MD, Karie Soda, MD, Marcelle Overlie, MD, Rolm Baptise, MD, and The Patient   PATIENT NAME:  Cheryl Holland, Cheryl Holland MR#: [id number]

## 2013-05-12 ENCOUNTER — Telehealth: Payer: Self-pay | Admitting: *Deleted

## 2013-05-12 NOTE — Telephone Encounter (Signed)
  Follow up Call-  Call back number 05/11/2013 05/14/2012 10/15/2011  Post procedure Call Back phone  # [phone] cell [phone]  Permission to leave phone message Yes Yes Yes     Patient questions:  Do you have a fever, pain , or abdominal swelling? no Pain Score  0 *  Have you tolerated food without any problems? yes  Have you been able to return to your normal activities? yes  Do you have any questions about your discharge instructions: Diet   no Medications  no Follow up visit  no  Do you have questions or concerns about your Care? no  Actions: * If pain score is 4 or above: No action needed, pain <4.

## 2013-05-18 ENCOUNTER — Encounter: Payer: Self-pay | Admitting: Internal Medicine

## 2013-06-15 ENCOUNTER — Encounter (INDEPENDENT_AMBULATORY_CARE_PROVIDER_SITE_OTHER): Payer: Self-pay

## 2013-06-28 ENCOUNTER — Telehealth: Payer: Self-pay | Admitting: Internal Medicine

## 2013-06-28 NOTE — Telephone Encounter (Signed)
Pt states she is very nervous about waiting 3 years to have another colon done. She did have an adenomatous polyp this time and she is very anxious about waiting 3 years. Pt would like to discuss with Dr. Marina Goodell if he would call her. Let pt know he was out of the office until Wed.

## 2013-06-29 NOTE — Telephone Encounter (Signed)
I let message on patient's voicemail. Please change her recall to 04-2014 (sooner than initially recommended). Thanks.

## 2013-06-30 NOTE — Telephone Encounter (Signed)
Recall changed and pt aware.

## 2013-07-06 ENCOUNTER — Encounter: Payer: 59 | Admitting: Internal Medicine

## 2013-07-06 ENCOUNTER — Other Ambulatory Visit: Payer: Self-pay | Admitting: Radiology

## 2013-07-16 ENCOUNTER — Other Ambulatory Visit: Payer: Self-pay | Admitting: Obstetrics and Gynecology

## 2014-03-16 ENCOUNTER — Encounter: Payer: Self-pay | Admitting: Internal Medicine

## 2014-05-04 ENCOUNTER — Ambulatory Visit (AMBULATORY_SURGERY_CENTER): Payer: Self-pay

## 2014-05-04 VITALS — Ht 62.5 in | Wt 121.0 lb

## 2014-05-04 DIAGNOSIS — C20 Malignant neoplasm of rectum: Secondary | ICD-10-CM

## 2014-05-04 MED ORDER — MOVIPREP 100 G PO SOLR: 1.0000 | Freq: Once | ORAL | Status: DC

## 2014-05-04 NOTE — Progress Notes (Signed)
No allergies to eggs or soy No diet/weight loss meds No past problems with anesthesia No home oxygen  Has email  Emmi instructions given for colonoscopy 

## 2014-05-18 ENCOUNTER — Ambulatory Visit (AMBULATORY_SURGERY_CENTER): Payer: 59 | Admitting: Internal Medicine

## 2014-05-18 ENCOUNTER — Encounter: Payer: Self-pay | Admitting: Internal Medicine

## 2014-05-18 VITALS — BP 90/48 | HR 63 | Temp 97.8°F | Resp 14 | Ht 62.5 in | Wt 121.0 lb

## 2014-05-18 DIAGNOSIS — C20 Malignant neoplasm of rectum: Secondary | ICD-10-CM

## 2014-05-18 DIAGNOSIS — Z1211 Encounter for screening for malignant neoplasm of colon: Secondary | ICD-10-CM

## 2014-05-18 DIAGNOSIS — Z85038 Personal history of other malignant neoplasm of large intestine: Secondary | ICD-10-CM

## 2014-05-18 DIAGNOSIS — D126 Benign neoplasm of colon, unspecified: Secondary | ICD-10-CM

## 2014-05-18 MED ORDER — SODIUM CHLORIDE 0.9 % IV SOLN
500.0000 mL | INTRAVENOUS | Status: DC
Start: 2014-05-18 — End: 2014-05-18

## 2014-05-18 NOTE — Patient Instructions (Signed)
YOU HAD AN ENDOSCOPIC PROCEDURE TODAY AT THE [facility]: Refer to the procedure report that was given to you for any specific questions about what was found during the examination.  If the procedure report does not answer your questions, please call your gastroenterologist to clarify.  If you requested that your care partner not be given the details of your procedure findings, then the procedure report has been included in a sealed envelope for you to review at your convenience later.  YOU SHOULD EXPECT: Some feelings of bloating in the abdomen. Passage of more gas than usual.  Walking can help get rid of the air that was put into your GI tract during the procedure and reduce the bloating. If you had a lower endoscopy (such as a colonoscopy or flexible sigmoidoscopy) you may notice spotting of blood in your stool or on the toilet paper. If you underwent a bowel prep for your procedure, then you may not have a normal bowel movement for a few days.  DIET: Your first meal following the procedure should be a light meal and then it is ok to progress to your normal diet.  A half-sandwich or bowl of soup is an example of a good first meal.  Heavy or fried foods are harder to digest and may make you feel nauseous or bloated.  Likewise meals heavy in dairy and vegetables can cause extra gas to form and this can also increase the bloating.  Drink plenty of fluids but you should avoid alcoholic beverages for 24 hours.  ACTIVITY: Your care partner should take you home directly after the procedure.  You should plan to take it easy, moving slowly for the rest of the day.  You can resume normal activity the day after the procedure however you should NOT DRIVE or use heavy machinery for 24 hours (because of the sedation medicines used during the test).    SYMPTOMS TO REPORT IMMEDIATELY: A gastroenterologist can be reached at any hour.  During normal business hours, 8:30 AM to 5:00 PM Monday through Friday,  call [phone].  After hours and on weekends, please call the GI answering service at [phone] who will take a message and have the physician on call contact you.   Following lower endoscopy (colonoscopy or flexible sigmoidoscopy):  Excessive amounts of blood in the stool  Significant tenderness or worsening of abdominal pains  Swelling of the abdomen that is new, acute  Fever of 100F or higher  FOLLOW UP: If any biopsies were taken you will be contacted by phone or by letter within the next 1-3 weeks.  Call your gastroenterologist if you have not heard about the biopsies in 3 weeks.  Our staff will call the home number listed on your records the next business day following your procedure to check on you and address any questions or concerns that you may have at that time regarding the information given to you following your procedure. This is a courtesy call and so if there is no answer at the home number and we have not heard from you through the emergency physician on call, we will assume that you have returned to your regular daily activities without incident.  SIGNATURES/CONFIDENTIALITY: You and/or your care partner have signed paperwork which will be entered into your electronic medical record.  These signatures attest to the fact that that the information above on your After Visit Summary has been reviewed and is understood.  Full responsibility of the confidentiality of this   discharge information lies with you and/or your care-partner.     Handouts were given to your care partner on diverticulosis and a high fiber diet with liberal fluid intake. You may resume your current medications today. Please call if any questions or concerns.   

## 2014-05-18 NOTE — Progress Notes (Signed)
No problems noted in the recovery room. maw 

## 2014-05-18 NOTE — Op Note (Signed)
[place]  [address] [place], [address]   COLONOSCOPY PROCEDURE REPORT  PATIENT: Cheryl, Holland  MR#: [id number] BIRTHDATE: 02/12/1961 , 47  yrs. old GENDER: Female ENDOSCOPIST: Eustace Quail, MD REFERRED [id number] Program Recall PROCEDURE DATE:  05/18/2014 PROCEDURE:   Colonoscopy, surveillance First Screening Colonoscopy - Avg.  risk and is 50 yrs.  old or older - No.  Prior Negative Screening - Now for repeat screening. N/A  History of Adenoma - Now for follow-up colonoscopy & has been > or = to 3 yrs.  N/A  Polyps Removed Today? No.  Recommend repeat exam, <10 yrs? Yes.  High risk (family or personal hx). ASA CLASS:   Class I INDICATIONS:High risk patient with personal history of rectal cancer (T1sm1 polyp 09-2011 s/p LAR ; F/U 04-2012; 04-2013 (SSP). MEDICATIONS: MAC sedation, administered by CRNA and propofol (Diprivan) 450mg  IV  DESCRIPTION OF PROCEDURE:   After the risks benefits and alternatives of the procedure were thoroughly explained, informed consent was obtained.  A digital rectal exam revealed no abnormalities of the rectum.   The LB [id number] [id number] and LB PFC-H190 [id number]  endoscope was introduced through the anus and advanced to the cecum, which was identified by both the appendix and ileocecal valve. No adverse events experienced.   The quality of the prep was excellent, using MoviPrep  The instrument was then slowly withdrawn as the colon was fully examined.  COLON FINDINGS: Mild diverticulosis was noted in the sigmoid colon. The colon was otherwise normal.  There was no  inflammation, polyps or cancers unless previously stated. Benign scar from prior resection present without abnormality.  Retroflexed views revealed small internal hemorrhoids. The time to cecum=4 minutes 0 seconds. Withdrawal time=10 minutes 30 seconds.  The scope was withdrawn and the procedure completed. COMPLICATIONS: There were no  complications.  ENDOSCOPIC IMPRESSION: 1.   Mild diverticulosis was noted in the sigmoid colon 2.   The colon was otherwise normal  RECOMMENDATIONS: 1. Repeat Colonoscopy in 1 year.   eSigned:  Eustace Quail, MD 05/18/2014 9:36 AM   cc: The Patient and Burnard Bunting, MD

## 2014-05-18 NOTE — Progress Notes (Signed)
A/ox3, pleased with MAC, report to RN 

## 2014-05-19 ENCOUNTER — Telehealth: Payer: Self-pay

## 2014-05-19 NOTE — Telephone Encounter (Signed)
Left a message at [phone] for the pt to call back if any questions or concerns. maw

## 2014-05-19 NOTE — Addendum Note (Signed)
Addended by: Lowry Ram on: 05/19/2014 03:10 PM   Modules accepted: Level of Service

## 2014-08-22 ENCOUNTER — Other Ambulatory Visit: Payer: Self-pay | Admitting: Obstetrics and Gynecology

## 2014-08-23 LAB — CYTOLOGY - PAP

## 2014-08-24 ENCOUNTER — Other Ambulatory Visit: Payer: Self-pay | Admitting: Obstetrics and Gynecology

## 2014-08-24 DIAGNOSIS — R922 Inconclusive mammogram: Secondary | ICD-10-CM

## 2014-08-24 DIAGNOSIS — Z85048 Personal history of other malignant neoplasm of rectum, rectosigmoid junction, and anus: Secondary | ICD-10-CM

## 2014-08-24 DIAGNOSIS — R9230 Dense breasts, unspecified: Secondary | ICD-10-CM

## 2014-09-01 ENCOUNTER — Other Ambulatory Visit: Payer: 59

## 2015-03-17 ENCOUNTER — Encounter: Payer: Self-pay | Admitting: Internal Medicine

## 2015-05-08 ENCOUNTER — Encounter: Payer: Self-pay | Admitting: Internal Medicine

## 2015-07-18 ENCOUNTER — Encounter: Payer: 59 | Admitting: Internal Medicine

## 2015-07-18 ENCOUNTER — Encounter: Payer: Self-pay | Admitting: Internal Medicine

## 2015-09-20 ENCOUNTER — Ambulatory Visit (AMBULATORY_SURGERY_CENTER): Payer: Self-pay | Admitting: *Deleted

## 2015-09-20 VITALS — Ht 62.5 in | Wt 124.0 lb

## 2015-09-20 DIAGNOSIS — Z85048 Personal history of other malignant neoplasm of rectum, rectosigmoid junction, and anus: Secondary | ICD-10-CM

## 2015-09-20 MED ORDER — NA SULFATE-K SULFATE-MG SULF 17.5-3.13-1.6 GM/177ML PO SOLN: 1.0000 | Freq: Once | ORAL | Status: DC

## 2015-09-20 NOTE — Progress Notes (Signed)
No egg or soy allergy known to patient  No issues with past sedation with any surgeries  or procedures, no intubation problems  No diet pills per patient  No home 02 use per patient   emmi video   [facility] umr-sample given-Samples of this drug were given to the patient, quantity 1, Lot Number [id number] exp 11-18 suprep sample given

## 2015-10-04 ENCOUNTER — Ambulatory Visit (AMBULATORY_SURGERY_CENTER): Payer: 59 | Admitting: Internal Medicine

## 2015-10-04 ENCOUNTER — Encounter: Payer: Self-pay | Admitting: Internal Medicine

## 2015-10-04 VITALS — BP 95/72 | HR 81 | Temp 97.8°F | Resp 22 | Ht 62.0 in | Wt 124.0 lb

## 2015-10-04 DIAGNOSIS — D122 Benign neoplasm of ascending colon: Secondary | ICD-10-CM

## 2015-10-04 DIAGNOSIS — Z85048 Personal history of other malignant neoplasm of rectum, rectosigmoid junction, and anus: Secondary | ICD-10-CM

## 2015-10-04 DIAGNOSIS — C20 Malignant neoplasm of rectum: Secondary | ICD-10-CM | POA: Diagnosis not present

## 2015-10-04 DIAGNOSIS — K621 Rectal polyp: Secondary | ICD-10-CM

## 2015-10-04 DIAGNOSIS — R002 Palpitations: Secondary | ICD-10-CM | POA: Diagnosis not present

## 2015-10-04 DIAGNOSIS — D128 Benign neoplasm of rectum: Secondary | ICD-10-CM

## 2015-10-04 MED ORDER — SODIUM CHLORIDE 0.9 % IV SOLN: 500.0000 mL | INTRAVENOUS | Status: DC

## 2015-10-04 NOTE — Progress Notes (Signed)
Called to room to assist during endoscopic procedure.  Patient ID and intended procedure confirmed with present staff. Received instructions for my participation in the procedure from the performing physician.  

## 2015-10-04 NOTE — Progress Notes (Signed)
No problems noted in the recovery room. maw 

## 2015-10-04 NOTE — Patient Instructions (Signed)
YOU HAD AN ENDOSCOPIC PROCEDURE TODAY AT [facility]:   Refer to the procedure report that was given to you for any specific questions about what was found during the examination.  If the procedure report does not answer your questions, please call your gastroenterologist to clarify.  If you requested that your care partner not be given the details of your procedure findings, then the procedure report has been included in a sealed envelope for you to review at your convenience later.  YOU SHOULD EXPECT: Some feelings of bloating in the abdomen. Passage of more gas than usual.  Walking can help get rid of the air that was put into your GI tract during the procedure and reduce the bloating. If you had a lower endoscopy (such as a colonoscopy or flexible sigmoidoscopy) you may notice spotting of blood in your stool or on the toilet paper. If you underwent a bowel prep for your procedure, you may not have a normal bowel movement for a few days.  Please Note:  You might notice some irritation and congestion in your nose or some drainage.  This is from the oxygen used during your procedure.  There is no need for concern and it should clear up in a day or so.  SYMPTOMS TO REPORT IMMEDIATELY:   Following lower endoscopy (colonoscopy or flexible sigmoidoscopy):  Excessive amounts of blood in the stool  Significant tenderness or worsening of abdominal pains  Swelling of the abdomen that is new, acute  Fever of 100F or higher   For urgent or emergent issues, a gastroenterologist can be reached at any hour by calling [phone].   DIET: Your first meal following the procedure should be a small meal and then it is ok to progress to your normal diet. Heavy or fried foods are harder to digest and may make you feel nauseous or bloated.  Likewise, meals heavy in dairy and vegetables can increase bloating.  Drink plenty of fluids but you should avoid alcoholic beverages for 24  hours.  ACTIVITY:  You should plan to take it easy for the rest of today and you should NOT DRIVE or use heavy machinery until tomorrow (because of the sedation medicines used during the test).    FOLLOW UP: Our staff will call the number listed on your records the next business day following your procedure to check on you and address any questions or concerns that you may have regarding the information given to you following your procedure. If we do not reach you, we will leave a message.  However, if you are feeling well and you are not experiencing any problems, there is no need to return our call.  We will assume that you have returned to your regular daily activities without incident.  If any biopsies were taken you will be contacted by phone or by letter within the next 1-3 weeks.  Please call us at [phone] if you have not heard about the biopsies in 3 weeks.    SIGNATURES/CONFIDENTIALITY: You and/or your care partner have signed paperwork which will be entered into your electronic medical record.  These signatures attest to the fact that that the information above on your After Visit Summary has been reviewed and is understood.  Full responsibility of the confidentiality of this discharge information lies with you and/or your care-partner.   Handouts were given to your care partner on polyps, hemorrhoids, diverticulosis, and a high fiber diet with liberal fluid intake. You may resume  current medications today. Await biopsy results. Please call if any questions or concerns.   

## 2015-10-04 NOTE — Progress Notes (Signed)
Report to PACU, RN, vss, BBS= Clear.  

## 2015-10-04 NOTE — Op Note (Signed)
[place]  [address]. [place], [address]   COLONOSCOPY PROCEDURE REPORT  PATIENT: Cheryl, Holland  MR#: [id number] BIRTHDATE: 06-10-1961 , 79  yrs. old GENDER: female ENDOSCOPIST: Eustace Quail, MD REFERRED [id number] Program Recall PROCEDURE DATE:  10/04/2015 PROCEDURE:   Colonoscopy, surveillance and Colonoscopy with snare polypectomy x 2 First Screening Colonoscopy - Avg.  risk and is 50 yrs.  old or older - No.  Prior Negative Screening - Now for repeat screening. N/A  History of Adenoma - Now for follow-up colonoscopy & has been > or = to 3 yrs.  N/A  Polyps removed today? Yes ASA CLASS:   Class I INDICATIONS:Surveillance due to prior colonic neoplasia and PH Colon or Rectal Adenocarcinoma. T1SM1 polyp January 2013 status post LAR; follow-up August 2013; August 2014 (SSP); September 2015 (negative). MEDICATIONS: Monitored anesthesia care and Propofol 350 mg IV  DESCRIPTION OF PROCEDURE:   After the risks benefits and alternatives of the procedure were thoroughly explained, informed consent was obtained.  The digital rectal exam revealed no abnormalities of the rectum.   The LB PFC-H190 [id number]  endoscope was introduced through the anus and advanced to the cecum, which was identified by both the appendix and ileocecal valve. No adverse events experienced.   The quality of the prep was excellent. (Suprep was used)  The instrument was then slowly withdrawn as the colon was fully examined. Estimated blood loss is zero unless otherwise noted in this procedure report.  COLON FINDINGS: Two polyps measuring 1 mm in size were found in the ascending colon and rectum.  A polypectomy was performed with a cold snare.  The resection was complete, the polyp tissue was completely retrieved and sent to histology.   There was moderate diverticulosis noted in the sigmoid colon and rare diverticula in the right colon.   The examination was otherwise  normal. Retroflexed views revealed internal hemorrhoids. The time to cecum = 7.1 Withdrawal time = 9.7   The scope was withdrawn and the procedure completed. COMPLICATIONS: There were no immediate complications.  ENDOSCOPIC IMPRESSION: 1.   Two polyps were found in the ascending colon and rectum; polypectomy was performed with a cold snare 2.   Moderate diverticulosis was noted in the sigmoid colon and rare diverticula in the right colon 3.   The examination was otherwise normal  RECOMMENDATIONS: 1. Repeat Colonoscopy in 1-3 years.  eSigned:  Eustace Quail, MD 10/04/2015 9:51 AM   cc: The Patient, Burnard Bunting, MD, and Michael Boston, MD

## 2015-10-05 ENCOUNTER — Telehealth: Payer: Self-pay

## 2015-10-05 NOTE — Telephone Encounter (Signed)
  Follow up Call-  Call back number 10/04/2015 05/18/2014 05/11/2013  Post procedure Call Back phone  # [phone]  Permission to leave phone message Yes Yes Yes     Patient questions:  Do you have a fever, pain , or abdominal swelling? No. Pain Score  0 *  Have you tolerated food without any problems? Yes.    Have you been able to return to your normal activities? Yes.    Do you have any questions about your discharge instructions: Diet   No. Medications  No. Follow up visit  No.  Do you have questions or concerns about your Care? No.  Actions: * If pain score is 4 or above: No action needed, pain <4.

## 2015-10-09 ENCOUNTER — Encounter: Payer: Self-pay | Admitting: Internal Medicine

## 2015-12-18 DIAGNOSIS — M26621 Arthralgia of right temporomandibular joint: Secondary | ICD-10-CM | POA: Diagnosis not present

## 2015-12-18 DIAGNOSIS — M542 Cervicalgia: Secondary | ICD-10-CM | POA: Insufficient documentation

## 2015-12-18 DIAGNOSIS — H9201 Otalgia, right ear: Secondary | ICD-10-CM | POA: Insufficient documentation

## 2016-04-07 DIAGNOSIS — J029 Acute pharyngitis, unspecified: Secondary | ICD-10-CM | POA: Diagnosis not present

## 2016-04-09 DIAGNOSIS — J029 Acute pharyngitis, unspecified: Secondary | ICD-10-CM | POA: Diagnosis not present

## 2016-04-09 DIAGNOSIS — R05 Cough: Secondary | ICD-10-CM | POA: Diagnosis not present

## 2016-04-09 DIAGNOSIS — Z6822 Body mass index (BMI) 22.0-22.9, adult: Secondary | ICD-10-CM | POA: Diagnosis not present

## 2016-04-09 MED FILL — AZITHROMYCIN 250 MG TABLET: 250 | 5 days supply | Qty: 6 | Fill #0

## 2016-04-11 MED FILL — CEFDINIR 300 MG CAPSULE: 300 | 7 days supply | Qty: 14 | Fill #0

## 2016-08-01 ENCOUNTER — Encounter: Payer: Self-pay | Admitting: Gastroenterology

## 2016-08-07 ENCOUNTER — Encounter: Payer: Self-pay | Admitting: Internal Medicine

## 2016-08-13 DIAGNOSIS — Z1231 Encounter for screening mammogram for malignant neoplasm of breast: Secondary | ICD-10-CM | POA: Diagnosis not present

## 2016-09-06 DIAGNOSIS — H524 Presbyopia: Secondary | ICD-10-CM | POA: Diagnosis not present

## 2016-09-18 ENCOUNTER — Encounter: Payer: Self-pay | Admitting: Internal Medicine

## 2016-09-18 ENCOUNTER — Telehealth: Payer: Self-pay | Admitting: *Deleted

## 2016-09-18 DIAGNOSIS — Z01419 Encounter for gynecological examination (general) (routine) without abnormal findings: Secondary | ICD-10-CM | POA: Diagnosis not present

## 2016-09-18 DIAGNOSIS — E785 Hyperlipidemia, unspecified: Secondary | ICD-10-CM | POA: Diagnosis not present

## 2016-09-18 DIAGNOSIS — Z13228 Encounter for screening for other metabolic disorders: Secondary | ICD-10-CM | POA: Diagnosis not present

## 2016-09-18 DIAGNOSIS — Z1329 Encounter for screening for other suspected endocrine disorder: Secondary | ICD-10-CM | POA: Diagnosis not present

## 2016-09-18 DIAGNOSIS — Z130 Encounter for screening for diseases of the blood and blood-forming organs and certain disorders involving the immune mechanism: Secondary | ICD-10-CM | POA: Diagnosis not present

## 2016-09-18 DIAGNOSIS — Z131 Encounter for screening for diabetes mellitus: Secondary | ICD-10-CM | POA: Diagnosis not present

## 2016-09-18 DIAGNOSIS — M8580 Other specified disorders of bone density and structure, unspecified site: Secondary | ICD-10-CM | POA: Diagnosis not present

## 2016-09-18 DIAGNOSIS — Z6823 Body mass index (BMI) 23.0-23.9, adult: Secondary | ICD-10-CM | POA: Diagnosis not present

## 2016-09-18 DIAGNOSIS — Z13820 Encounter for screening for osteoporosis: Secondary | ICD-10-CM | POA: Diagnosis not present

## 2016-09-18 NOTE — Telephone Encounter (Signed)
Patient states that she last saw MD Benay Spice 3 years ago and at that time patient had rectal polyp that was removed. Patient received an all clear from MD [facility], but would like a scan soon to see if everything is still clear.

## 2016-09-19 DIAGNOSIS — D225 Melanocytic nevi of trunk: Secondary | ICD-10-CM | POA: Diagnosis not present

## 2016-09-19 DIAGNOSIS — L570 Actinic keratosis: Secondary | ICD-10-CM | POA: Diagnosis not present

## 2016-09-19 DIAGNOSIS — D1801 Hemangioma of skin and subcutaneous tissue: Secondary | ICD-10-CM | POA: Diagnosis not present

## 2016-09-19 DIAGNOSIS — L820 Inflamed seborrheic keratosis: Secondary | ICD-10-CM | POA: Diagnosis not present

## 2016-09-19 DIAGNOSIS — L821 Other seborrheic keratosis: Secondary | ICD-10-CM | POA: Diagnosis not present

## 2016-09-20 ENCOUNTER — Telehealth: Payer: Self-pay | Admitting: *Deleted

## 2016-09-20 NOTE — Telephone Encounter (Signed)
Discussed pt's call with Dr. Benay Spice. Returned call to pt, informed her Dr. Benay Spice recommends continued follow up with GI for colonoscopy. Does not recommend surveillance scan. She voiced understanding.

## 2016-10-09 ENCOUNTER — Ambulatory Visit (AMBULATORY_SURGERY_CENTER): Payer: Self-pay | Admitting: *Deleted

## 2016-10-09 VITALS — Ht 62.0 in | Wt 128.0 lb

## 2016-10-09 DIAGNOSIS — Z85048 Personal history of other malignant neoplasm of rectum, rectosigmoid junction, and anus: Secondary | ICD-10-CM

## 2016-10-09 DIAGNOSIS — Z86010 Personal history of colonic polyps: Secondary | ICD-10-CM

## 2016-10-09 MED ORDER — NA SULFATE-K SULFATE-MG SULF 17.5-3.13-1.6 GM/177ML PO SOLN: ORAL | 0 refills | Status: DC

## 2016-10-09 MED FILL — SUPREP BOWEL PREP KIT: 17.5-3.13-1 | 1 days supply | Qty: 354 | Fill #0

## 2016-10-09 NOTE — Progress Notes (Signed)
Patient denies any allergies to eggs or soy. Patient denies any problems with anesthesia/sedation. Patient denies any oxygen use at home and does not take any diet/weight loss medications. EMMI education declined by patient.  

## 2016-10-17 HISTORY — PX: COLONOSCOPY: SHX174

## 2016-10-28 ENCOUNTER — Encounter: Payer: 59 | Admitting: Internal Medicine

## 2016-10-28 ENCOUNTER — Encounter: Payer: Self-pay | Admitting: Internal Medicine

## 2016-10-28 ENCOUNTER — Ambulatory Visit (AMBULATORY_SURGERY_CENTER): Payer: 59 | Admitting: Internal Medicine

## 2016-10-28 VITALS — BP 100/55 | HR 85 | Temp 98.6°F | Resp 11 | Ht 62.0 in | Wt 128.0 lb

## 2016-10-28 DIAGNOSIS — Z85038 Personal history of other malignant neoplasm of large intestine: Secondary | ICD-10-CM | POA: Diagnosis not present

## 2016-10-28 DIAGNOSIS — Z85048 Personal history of other malignant neoplasm of rectum, rectosigmoid junction, and anus: Secondary | ICD-10-CM

## 2016-10-28 DIAGNOSIS — Z86010 Personal history of colonic polyps: Secondary | ICD-10-CM | POA: Diagnosis not present

## 2016-10-28 DIAGNOSIS — R002 Palpitations: Secondary | ICD-10-CM | POA: Diagnosis not present

## 2016-10-28 MED ORDER — SODIUM CHLORIDE 0.9 % IV SOLN: 500.0000 mL | INTRAVENOUS | Status: DC

## 2016-10-28 NOTE — Progress Notes (Signed)
Pt's states no medical or surgical changes since previsit or office visit. 

## 2016-10-28 NOTE — Patient Instructions (Signed)
Impression/Recommendations:  Diverticulosis handout given to patient. Hemorrhoid handout given to patient.  Repeat colonoscopy in 1-3 years for surveillance.  YOU HAD AN ENDOSCOPIC PROCEDURE TODAY AT [facility]:   Refer to the procedure report that was given to you for any specific questions about what was found during the examination.  If the procedure report does not answer your questions, please call your gastroenterologist to clarify.  If you requested that your care partner not be given the details of your procedure findings, then the procedure report has been included in a sealed envelope for you to review at your convenience later.  YOU SHOULD EXPECT: Some feelings of bloating in the abdomen. Passage of more gas than usual.  Walking can help get rid of the air that was put into your GI tract during the procedure and reduce the bloating. If you had a lower endoscopy (such as a colonoscopy or flexible sigmoidoscopy) you may notice spotting of blood in your stool or on the toilet paper. If you underwent a bowel prep for your procedure, you may not have a normal bowel movement for a few days.  Please Note:  You might notice some irritation and congestion in your nose or some drainage.  This is from the oxygen used during your procedure.  There is no need for concern and it should clear up in a day or so.  SYMPTOMS TO REPORT IMMEDIATELY:   Following lower endoscopy (colonoscopy or flexible sigmoidoscopy):  Excessive amounts of blood in the stool  Significant tenderness or worsening of abdominal pains  Swelling of the abdomen that is new, acute  Fever of 100F or higher For urgent or emergent issues, a gastroenterologist can be reached at any hour by calling [phone].   DIET:  We do recommend a small meal at first, but then you may proceed to your regular diet.  Drink plenty of fluids but you should avoid alcoholic beverages for 24 hours.  ACTIVITY:  You should plan  to take it easy for the rest of today and you should NOT DRIVE or use heavy machinery until tomorrow (because of the sedation medicines used during the test).    FOLLOW UP: Our staff will call the number listed on your records the next business day following your procedure to check on you and address any questions or concerns that you may have regarding the information given to you following your procedure. If we do not reach you, we will leave a message.  However, if you are feeling well and you are not experiencing any problems, there is no need to return our call.  We will assume that you have returned to your regular daily activities without incident.  If any biopsies were taken you will be contacted by phone or by letter within the next 1-3 weeks.  Please call us at [phone] if you have not heard about the biopsies in 3 weeks.    SIGNATURES/CONFIDENTIALITY: You and/or your care partner have signed paperwork which will be entered into your electronic medical record.  These signatures attest to the fact that that the information above on your After Visit Summary has been reviewed and is understood.  Full responsibility of the confidentiality of this discharge information lies with you and/or your care-partner.

## 2016-10-28 NOTE — Progress Notes (Signed)
Report given to PACU, vss 

## 2016-10-28 NOTE — Op Note (Signed)
[facility] Patient Name: Cheryl Holland Procedure Date: 10/28/2016 9:17 AM MRN: [id number] Endoscopist: Docia Chuck. Henrene Pastor , MD Age: 56 Referring MD:  Date of Birth: 01-19-1961 Gender: Female Account #: [id number] Procedure:                Colonoscopy Indications:              High risk colon cancer surveillance: Personal                            history of colon cancer. Malignant rectal polyp                            diagnosed January 2013, status post subsequent                            transanal surgical excision (negative for residual                            tumor); follow-up examinations August 2013, August                            2014, September 2015, and January 2017. Sessile                            serrated polyp 2014. Otherwise negative                            examinations for neoplasia Medicines:                Monitored Anesthesia Care Procedure:                Pre-Anesthesia Assessment:                           - Prior to the procedure, a History and Physical                            was performed, and patient medications and                            allergies were reviewed. The patient's tolerance of                            previous anesthesia was also reviewed. The risks                            and benefits of the procedure and the sedation                            options and risks were discussed with the patient.                            All questions were answered, and informed consent  was obtained. Prior Anticoagulants: The patient has                            taken no previous anticoagulant or antiplatelet                            agents. ASA Grade Assessment: I - A normal, healthy                            patient. After reviewing the risks and benefits,                            the patient was deemed in satisfactory condition to                            undergo the procedure.           After obtaining informed consent, the colonoscope                            was passed under direct vision. Throughout the                            procedure, the patient's blood pressure, pulse, and                            oxygen saturations were monitored continuously. The                            Model PCF-H190DL [id number]) scope was introduced                            through the anus and advanced to the the cecum,                            identified by appendiceal orifice and ileocecal                            valve. The ileocecal valve, appendiceal orifice,                            and rectum were photographed. The quality of the                            bowel preparation was excellent. The colonoscopy                            was performed without difficulty. The patient                            tolerated the procedure well. The bowel preparation                            used was SUPREP. Scope In: 9:21:36 AM Scope Out: 9:37:10 AM  Scope Withdrawal Time: 0 hours 10 minutes 43 seconds  Total Procedure Duration: 0 hours 15 minutes 34 seconds  Findings:                 A few medium-mouthed diverticula were found in the                            sigmoid colon and ascending colon.                           Internal hemorrhoids were found during retroflexion.                           The exam was otherwise without abnormality on                            direct and retroflexion views. Prior scarring in                            the rectum from surgery noted. Complications:            No immediate complications. Estimated blood loss:                            None. Estimated Blood Loss:     Estimated blood loss: none. Impression:               - Diverticulosis in the sigmoid colon and in the                            ascending colon.                           - Internal hemorrhoids.                           - The examination was otherwise normal on  direct                            and retroflexion views.                           - No specimens collected. Recommendation:           - Repeat colonoscopy in 1-3 years for surveillance.                           - Patient has a contact number available for                            emergencies. The signs and symptoms of potential                            delayed complications were discussed with the                            patient. Return to normal activities tomorrow.  Written discharge instructions were provided to the                            patient.                           - Resume previous diet.                           - Continue present medications. Docia Chuck. Henrene Pastor, MD 10/28/2016 9:43:31 AM This report has been signed electronically.

## 2016-10-29 ENCOUNTER — Telehealth: Payer: Self-pay

## 2016-10-29 NOTE — Telephone Encounter (Signed)
  Follow up Call-  Call back number 10/28/2016 10/04/2015 05/18/2014  Post procedure Call Back phone  # [phone]  Permission to leave phone message Yes Yes Yes  Some recent data might be hidden     Patient questions:  Do you have a fever, pain , or abdominal swelling? No. Pain Score  0 *  Have you tolerated food without any problems? Yes.    Have you been able to return to your normal activities? Yes.    Do you have any questions about your discharge instructions: Diet   No. Medications  No. Follow up visit  No.  Do you have questions or concerns about your Care? No.  Actions: * If pain score is 4 or above: No action needed, pain <4.

## 2017-06-05 DIAGNOSIS — H00025 Hordeolum internum left lower eyelid: Secondary | ICD-10-CM | POA: Diagnosis not present

## 2017-06-05 MED FILL — GENTAMICIN 3 MG/ML EYE DRP: 0.3 | 25 days supply | Qty: 5 | Fill #0

## 2017-09-17 DIAGNOSIS — Z1231 Encounter for screening mammogram for malignant neoplasm of breast: Secondary | ICD-10-CM | POA: Diagnosis not present

## 2017-09-19 DIAGNOSIS — Z6823 Body mass index (BMI) 23.0-23.9, adult: Secondary | ICD-10-CM | POA: Diagnosis not present

## 2017-09-19 DIAGNOSIS — Z01419 Encounter for gynecological examination (general) (routine) without abnormal findings: Secondary | ICD-10-CM | POA: Diagnosis not present

## 2017-09-25 ENCOUNTER — Ambulatory Visit (INDEPENDENT_AMBULATORY_CARE_PROVIDER_SITE_OTHER): Payer: 59

## 2017-09-25 ENCOUNTER — Encounter (INDEPENDENT_AMBULATORY_CARE_PROVIDER_SITE_OTHER): Payer: Self-pay | Admitting: Physician Assistant

## 2017-09-25 ENCOUNTER — Ambulatory Visit (INDEPENDENT_AMBULATORY_CARE_PROVIDER_SITE_OTHER): Payer: 59 | Admitting: Physician Assistant

## 2017-09-25 DIAGNOSIS — M25561 Pain in right knee: Secondary | ICD-10-CM

## 2017-09-25 MED ORDER — LIDOCAINE HCL 1 % IJ SOLN
3.0000 mL | INTRAMUSCULAR | Status: AC | PRN
  Administered 2017-09-25: 3 mL

## 2017-09-25 MED ORDER — METHYLPREDNISOLONE ACETATE 40 MG/ML IJ SUSP
40.0000 mg | INTRAMUSCULAR | Status: AC | PRN
  Administered 2017-09-25: 40 mg via INTRA_ARTICULAR

## 2017-09-25 NOTE — Progress Notes (Signed)
Office Visit Note   Patient: Cheryl Holland           Date of Birth: 30-Jul-1961           MRN: [id number] Visit Date: 09/25/2017              Requested by: Burnard Bunting, MD [address] [place],  [address] PCP: Burnard Bunting, MD   Assessment & Plan: Visit Diagnoses:  1. 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  Discussed knee friendlyexercises with her.  She will be mindful of any mechanical symptoms of the knee which are reviewed with her today.  If she has any mechanical symptoms or pain persist or her knee pain does not improve we will obtain an MRI to rule out internal derangement.  Questions were encouraged and answered at length today by Dr. Ninfa Linden and myself.  Also discussed with her IT band stretching exercises and gastroc stretching exercises.  Follow-Up Instructions: Return if symptoms worsen or fail to improve.   Orders:  Orders Placed This Encounter  Procedures  . Large Joint Inj  . XR Knee 1-2 Views Right   No orders of the defined types were placed in this encounter.     Procedures: Large Joint Inj on 09/25/2017 5:27 PM Indications: pain Details: 22 G 1.5 in needle, anterolateral approach  Arthrogram: No  Medications: 3 mL lidocaine 1 %; 40 mg methylPREDNISolone acetate 40 MG/ML Outcome: tolerated well, no immediate complications Procedure, treatment alternatives, risks and benefits explained, specific risks discussed. Consent was given by the patient. Immediately prior to procedure a time out was called to verify the correct patient, procedure, equipment, support staff and site/side marked as required. Patient was prepped and draped in the usual sterile fashion.       Clinical Data: No additional findings.   Subjective: Chief Complaint  Patient presents with  . Right Knee - Cyst    Possible Bakers CYST R Knee    HPI Cheryl Holland is a 57 year old female comes in today with  right knee pain that began last Tuesday after doing a sweat exercise class.  She states that she was doing some jump lunges.  This past weekend she was also up and down a ladder a lot feels this may have aggravated her knee.  She denies any mechanical symptoms in the knee.  She does note some swelling which is resolving.  States lunges bother her knee.  She is had no treatment for the knee.  Her husband did have her undergo a ultrasound of her right knee is revealed no DVT.  Dr. Donnetta Hutching felt that she may have a possible Baker's cyst. Review of Systems No mechanical symptoms of the right knee.  Otherwise review of systems negative.  Objective: Vital Signs: There were no vitals taken for this visit.  Physical Exam  Constitutional: She is oriented to person, place, and time. She appears well-developed and well-nourished. No distress.  Pulmonary/Chest: Effort normal.  Neurological: She is alert and oriented to person, place, and time.  Skin: She is not diaphoretic.  Psychiatric: She has a normal mood and affect. Her behavior is normal.    Ortho Exam Bilateral knees no abnormal warmth erythema or edema.  Very slight effusion of the right knee.  Prominence in the popliteal space consistent with Baker's cyst.  No instability valgus varus stressing either knee.  Right knee anterior drawer is negative.  Tenderness along the lateral  joint line. Tenderness over the right IT BAND.  Specialty Comments:  No specialty comments available.  Imaging: Xr Knee 1-2 Views Right  Result Date: 09/25/2017 AP and lateral views right knee: No acute fracture. Knee well located. All three compartments are well preserved. No acute findings.     PMFS History: Patient Active Problem List   Diagnosis Date Noted  . Arthralgia of right temporomandibular joint 12/18/2015  . Neck pain on right side 12/18/2015  . Otalgia of right ear 12/18/2015  . Palpitations 01/07/2013  . T1sm1 Rectal cancer in proximal rectal polyp s/p  polypectomy & f/u partial proctectomy by TEM 10/30/2011  10/18/2011   Past Medical History:  Diagnosis Date  . Allergy    shrimp  . Cyst of right kidney    PER CT REPORT 10/21/11  . Gallstones    SEEN ON CT REPORT 10/21/11  . Pulmonary nodules    SEEN ON CT REPORT 10/21/11--AND NOTE FROM DR. PERRY THAT PT AND HUSBAND NOTIFIED--PT MAY NEED FOLLOW UP CT CHEST IN 6 MONTHS  . Rectal cancer in proximal rectal polyp s/p polypectomy 10/18/2011    Family History  Problem Relation Age of Onset  . Dementia Father   . Colon cancer Neg Hx   . Esophageal cancer Neg Hx   . Rectal cancer Neg Hx   . Stomach cancer Neg Hx   . Colon polyps Neg Hx     Past Surgical History:  Procedure Laterality Date  . COLONOSCOPY    . EUS  10/18/2011   Procedure: LOWER ENDOSCOPIC ULTRASOUND (EUS);  Surgeon: Owens Loffler, MD;  Location: [facility];  Service: Endoscopy;  Laterality: N/A;  tattoo,  moderate sedation, TR  . FOOT GANGLION EXCISION  2004 - approximate   right  . INGUINAL HERNIA REPAIR  2001   right  . PARTIAL PROCTECTOMY  10/30/2011   TEM resection of polyp base, path = T1sm1  . POLYPECTOMY    . PROCTOSCOPY  10/30/2011   Procedure: PROCTOSCOPY;  Surgeon: Adin Hector, MD;  Location: [facility];  Service: General;  Laterality: N/A;   Social History   Occupational History  . Not on file  Tobacco Use  . Smoking status: Never Smoker  . Smokeless tobacco: Never Used  Substance and Sexual Activity  . Alcohol use: Yes    Alcohol/week: 1.2 oz    Types: 2 Glasses of wine per week  . Drug use: No  . Sexual activity: Not on file

## 2017-09-29 DIAGNOSIS — Z13220 Encounter for screening for lipoid disorders: Secondary | ICD-10-CM | POA: Diagnosis not present

## 2017-09-29 DIAGNOSIS — H02822 Cysts of right lower eyelid: Secondary | ICD-10-CM | POA: Diagnosis not present

## 2017-10-06 DIAGNOSIS — J189 Pneumonia, unspecified organism: Secondary | ICD-10-CM | POA: Diagnosis not present

## 2017-10-06 DIAGNOSIS — R05 Cough: Secondary | ICD-10-CM | POA: Diagnosis not present

## 2017-10-06 DIAGNOSIS — Z6822 Body mass index (BMI) 22.0-22.9, adult: Secondary | ICD-10-CM | POA: Diagnosis not present

## 2017-10-06 MED FILL — levoFLOXacin 500 MG TABS: 500 | 7 days supply | Qty: 7 | Fill #0

## 2017-10-06 MED FILL — VENTOLIN HFA 90 MCG INHALER: 108 (90 BAS | 17 days supply | Qty: 18 | Fill #0

## 2017-10-06 MED FILL — predniSONE 10 MG TABS: 10 | 6 days supply | Qty: 21 | Fill #0

## 2017-10-06 MED FILL — HYDROCODONE-HOMATROPINE SYR: 5-1.5 | 6 days supply | Qty: 120 | Fill #0

## 2017-10-16 DIAGNOSIS — R5383 Other fatigue: Secondary | ICD-10-CM | POA: Diagnosis not present

## 2017-10-16 DIAGNOSIS — Z6821 Body mass index (BMI) 21.0-21.9, adult: Secondary | ICD-10-CM | POA: Diagnosis not present

## 2017-10-16 DIAGNOSIS — R197 Diarrhea, unspecified: Secondary | ICD-10-CM | POA: Diagnosis not present

## 2017-10-16 DIAGNOSIS — J189 Pneumonia, unspecified organism: Secondary | ICD-10-CM | POA: Diagnosis not present

## 2017-10-16 DIAGNOSIS — R05 Cough: Secondary | ICD-10-CM | POA: Diagnosis not present

## 2017-10-17 MED FILL — HYDROCODONE-HOMATROPINE SYR: 5-1.5 | 9 days supply | Qty: 180 | Fill #0

## 2017-10-27 DIAGNOSIS — C189 Malignant neoplasm of colon, unspecified: Secondary | ICD-10-CM | POA: Diagnosis not present

## 2017-10-27 DIAGNOSIS — R002 Palpitations: Secondary | ICD-10-CM | POA: Diagnosis not present

## 2017-10-27 DIAGNOSIS — Z6821 Body mass index (BMI) 21.0-21.9, adult: Secondary | ICD-10-CM | POA: Diagnosis not present

## 2017-10-27 DIAGNOSIS — Z0000 Encounter for general adult medical examination without abnormal findings: Secondary | ICD-10-CM | POA: Diagnosis not present

## 2017-10-27 DIAGNOSIS — J189 Pneumonia, unspecified organism: Secondary | ICD-10-CM | POA: Diagnosis not present

## 2017-10-27 DIAGNOSIS — K219 Gastro-esophageal reflux disease without esophagitis: Secondary | ICD-10-CM | POA: Diagnosis not present

## 2017-10-27 MED FILL — POTASSIUM CL ER 20 MEQ TABL: 20 | 30 days supply | Qty: 30 | Fill #0

## 2017-10-28 DIAGNOSIS — D2272 Melanocytic nevi of left lower limb, including hip: Secondary | ICD-10-CM | POA: Diagnosis not present

## 2017-10-28 DIAGNOSIS — D2271 Melanocytic nevi of right lower limb, including hip: Secondary | ICD-10-CM | POA: Diagnosis not present

## 2017-10-28 DIAGNOSIS — D225 Melanocytic nevi of trunk: Secondary | ICD-10-CM | POA: Diagnosis not present

## 2017-10-28 DIAGNOSIS — L570 Actinic keratosis: Secondary | ICD-10-CM | POA: Diagnosis not present

## 2017-10-28 DIAGNOSIS — D2261 Melanocytic nevi of right upper limb, including shoulder: Secondary | ICD-10-CM | POA: Diagnosis not present

## 2017-10-28 DIAGNOSIS — D2262 Melanocytic nevi of left upper limb, including shoulder: Secondary | ICD-10-CM | POA: Diagnosis not present

## 2017-11-22 ENCOUNTER — Encounter: Payer: Self-pay | Admitting: Internal Medicine

## 2017-11-24 MED FILL — KLOR-CON M20 TABLET: 20 | 90 days supply | Qty: 90 | Fill #0

## 2017-11-24 MED FILL — OMEPRAZOLE 20 MG CAP: 20 | 90 days supply | Qty: 90 | Fill #0

## 2018-02-13 DIAGNOSIS — H0289 Other specified disorders of eyelid: Secondary | ICD-10-CM | POA: Diagnosis not present

## 2018-02-13 DIAGNOSIS — H10413 Chronic giant papillary conjunctivitis, bilateral: Secondary | ICD-10-CM | POA: Diagnosis not present

## 2018-03-04 DIAGNOSIS — L570 Actinic keratosis: Secondary | ICD-10-CM | POA: Diagnosis not present

## 2018-03-04 DIAGNOSIS — L821 Other seborrheic keratosis: Secondary | ICD-10-CM | POA: Diagnosis not present

## 2018-03-09 MED FILL — OMEPRAZOLE 20 MG CAP: 20 | 90 days supply | Qty: 90 | Fill #1

## 2018-05-26 MED FILL — POTASSIUM CL ER 20 MEQ TAB: 20 | 90 days supply | Qty: 90 | Fill #1

## 2018-07-24 ENCOUNTER — Encounter: Payer: Self-pay | Admitting: Internal Medicine

## 2018-08-20 ENCOUNTER — Other Ambulatory Visit: Payer: Self-pay

## 2018-08-20 ENCOUNTER — Ambulatory Visit (AMBULATORY_SURGERY_CENTER): Payer: Self-pay | Admitting: *Deleted

## 2018-08-20 ENCOUNTER — Encounter: Payer: Self-pay | Admitting: Internal Medicine

## 2018-08-20 VITALS — Ht 61.0 in | Wt 129.0 lb

## 2018-08-20 DIAGNOSIS — Z86010 Personal history of colonic polyps: Secondary | ICD-10-CM

## 2018-08-20 MED ORDER — SUPREP BOWEL PREP KIT 17.5-3.13-1.6 GM/177ML PO SOLN: 1.0000 | Freq: Once | ORAL | 0 refills | Status: AC

## 2018-08-20 MED FILL — SUPREP BOWEL PREP KIT: 17.5-3.13-1 | 1 days supply | Qty: 354 | Fill #0

## 2018-08-20 NOTE — Progress Notes (Signed)
Patient denies any allergies to egg or soy products. Patient denies complications with anesthesia/sedation.  Patient denies oxygen use at home and denies diet medications. Patient denies information on colonoscopy. 

## 2018-08-24 MED FILL — OMEPRAZOLE 20 MG CPDR: 20 | 90 days supply | Qty: 90 | Fill #0

## 2018-08-27 ENCOUNTER — Ambulatory Visit (AMBULATORY_SURGERY_CENTER): Payer: 59 | Admitting: Internal Medicine

## 2018-08-27 ENCOUNTER — Telehealth: Payer: Self-pay | Admitting: Internal Medicine

## 2018-08-27 ENCOUNTER — Encounter: Payer: Self-pay | Admitting: Internal Medicine

## 2018-08-27 VITALS — BP 111/63 | HR 82 | Temp 99.3°F | Resp 12 | Ht 61.0 in | Wt 129.0 lb

## 2018-08-27 DIAGNOSIS — Z86010 Personal history of colonic polyps: Secondary | ICD-10-CM

## 2018-08-27 DIAGNOSIS — D120 Benign neoplasm of cecum: Secondary | ICD-10-CM

## 2018-08-27 DIAGNOSIS — Z85048 Personal history of other malignant neoplasm of rectum, rectosigmoid junction, and anus: Secondary | ICD-10-CM

## 2018-08-27 DIAGNOSIS — D124 Benign neoplasm of descending colon: Secondary | ICD-10-CM | POA: Diagnosis not present

## 2018-08-27 DIAGNOSIS — D122 Benign neoplasm of ascending colon: Secondary | ICD-10-CM | POA: Diagnosis not present

## 2018-08-27 DIAGNOSIS — K219 Gastro-esophageal reflux disease without esophagitis: Secondary | ICD-10-CM | POA: Diagnosis not present

## 2018-08-27 MED ORDER — SODIUM CHLORIDE 0.9 % IV SOLN: 500.0000 mL | Freq: Once | INTRAVENOUS | Status: DC

## 2018-08-27 NOTE — Telephone Encounter (Signed)
Called patient back concerning her nausea and vomiting. She was able to complete all of prep but got sick and vomited it an hour later. Patient was worried at the time of her call but has since started running a pale yellow. I told that pale yellow was ideal. I discussed with Dr. Henrene Pastor and he said everything was fine and to plan on coming in as directed.

## 2018-08-27 NOTE — Op Note (Signed)
[facility] Patient Name: Cheryl Holland Procedure Date: 08/27/2018 1:11 PM MRN: [id number] Endoscopist: Docia Chuck. Henrene Pastor , MD Age: 57 Referring MD:  Date of Birth: [birth year] Gender: Female Account #: [id number] Procedure:                Colonoscopy with cold snare polypectomy x 3 Indications:              High risk colon cancer surveillance: Personal                            history of sessile serrated colon polyp (less than                            10 mm in size) with no dysplasia, High risk colon                            cancer surveillance: Personal history of colon                            cancer Medicines:                Monitored Anesthesia Care Procedure:                Pre-Anesthesia Assessment:                           - Prior to the procedure, a History and Physical                            was performed, and patient medications and                            allergies were reviewed. The patient's tolerance of                            previous anesthesia was also reviewed. The risks                            and benefits of the procedure and the sedation                            options and risks were discussed with the patient.                            All questions were answered, and informed consent                            was obtained. Prior Anticoagulants: The patient has                            taken no previous anticoagulant or antiplatelet                            agents. ASA Grade Assessment: I - A normal, healthy  patient. After reviewing the risks and benefits,                            the patient was deemed in satisfactory condition to                            undergo the procedure.                           After obtaining informed consent, the colonoscope                            was passed under direct vision. Throughout the                            procedure, the patient's blood pressure,  pulse, and                            oxygen saturations were monitored continuously. The                            Model PCF-H190DL [id number]) scope was introduced                            through the anus and advanced to the the cecum,                            identified by appendiceal orifice and ileocecal                            valve. The ileocecal valve, appendiceal orifice,                            and rectum were photographed. The quality of the                            bowel preparation was excellent. The colonoscopy                            was performed without difficulty. The patient                            tolerated the procedure well. The bowel preparation                            used was SUPREP. Scope In: 1:17:09 PM Scope Out: 1:34:38 PM Scope Withdrawal Time: 0 hours 12 minutes 52 seconds  Total Procedure Duration: 0 hours 17 minutes 29 seconds  Findings:                 Three polyps were found in the descending colon,                            ascending colon and cecum. The polyps were 1 to 2  mm in size. These polyps were removed with a cold                            snare. Resection and retrieval were complete.                           A few diverticula were found in the sigmoid colon                            and ascending colon.                           Internal hemorrhoids were found during                            retroflexion. The hemorrhoids were small.                           Prior area of transanal excision revealed scarring                            as expected with no other abnormalities. The exam                            was otherwise without abnormality on direct and                            retroflexion views. Complications:            No immediate complications. Estimated blood loss:                            None. Estimated Blood Loss:     Estimated blood loss: none. Impression:                - Three 1 to 2 mm polyps in the descending colon,                            in the ascending colon and in the cecum, removed                            with a cold snare. Resected and retrieved.                           - Diverticulosis in the sigmoid colon and in the                            ascending colon.                           - Internal hemorrhoids.                           - The examination was otherwise normal on direct  and retroflexion views. Recommendation:           - Repeat colonoscopy in 3 years for surveillance.                           - Patient has a contact number available for                            emergencies. The signs and symptoms of potential                            delayed complications were discussed with the                            patient. Return to normal activities tomorrow.                            Written discharge instructions were provided to the                            patient.                           - Resume previous diet.                           - Continue present medications.                           - Await pathology results. Docia Chuck. Henrene Pastor, MD 08/27/2018 1:41:23 PM This report has been signed electronically.

## 2018-08-27 NOTE — Progress Notes (Signed)
Called to room to assist during endoscopic procedure.  Patient ID and intended procedure confirmed with present staff. Received instructions for my participation in the procedure from the performing physician.  

## 2018-08-27 NOTE — Patient Instructions (Signed)
Await pathology results. Continue present medications. Please read handouts provided.     YOU HAD AN ENDOSCOPIC PROCEDURE TODAY AT THE [facility]:   Refer to the procedure report that was given to you for any specific questions about what was found during the examination.  If the procedure report does not answer your questions, please call your gastroenterologist to clarify.  If you requested that your care partner not be given the details of your procedure findings, then the procedure report has been included in a sealed envelope for you to review at your convenience later.  YOU SHOULD EXPECT: Some feelings of bloating in the abdomen. Passage of more gas than usual.  Walking can help get rid of the air that was put into your GI tract during the procedure and reduce the bloating. If you had a lower endoscopy (such as a colonoscopy or flexible sigmoidoscopy) you may notice spotting of blood in your stool or on the toilet paper. If you underwent a bowel prep for your procedure, you may not have a normal bowel movement for a few days.  Please Note:  You might notice some irritation and congestion in your nose or some drainage.  This is from the oxygen used during your procedure.  There is no need for concern and it should clear up in a day or so.  SYMPTOMS TO REPORT IMMEDIATELY:   Following lower endoscopy (colonoscopy or flexible sigmoidoscopy):  Excessive amounts of blood in the stool  Significant tenderness or worsening of abdominal pains  Swelling of the abdomen that is new, acute  Fever of 100F or higher    For urgent or emergent issues, a gastroenterologist can be reached at any hour by calling [phone].   DIET:  We do recommend a small meal at first, but then you may proceed to your regular diet.  Drink plenty of fluids but you should avoid alcoholic beverages for 24 hours.  ACTIVITY:  You should plan to take it easy for the rest of today and you should NOT DRIVE  or use heavy machinery until tomorrow (because of the sedation medicines used during the test).    FOLLOW UP: Our staff will call the number listed on your records the next business day following your procedure to check on you and address any questions or concerns that you may have regarding the information given to you following your procedure. If we do not reach you, we will leave a message.  However, if you are feeling well and you are not experiencing any problems, there is no need to return our call.  We will assume that you have returned to your regular daily activities without incident.  If any biopsies were taken you will be contacted by phone or by letter within the next 1-3 weeks.  Please call us at [phone] if you have not heard about the biopsies in 3 weeks.    SIGNATURES/CONFIDENTIALITY: You and/or your care partner have signed paperwork which will be entered into your electronic medical record.  These signatures attest to the fact that that the information above on your After Visit Summary has been reviewed and is understood.  Full responsibility of the confidentiality of this discharge information lies with you and/or your care-partner. 

## 2018-08-27 NOTE — Progress Notes (Signed)
Report to PACU, RN, vss, BBS= Clear.  

## 2018-08-27 NOTE — Progress Notes (Signed)
Pt's states no medical or surgical changes since previsit or office visit. 

## 2018-08-27 NOTE — Telephone Encounter (Signed)
Patient states that she just threw up all the prep that she drank at 3:30am her procedure is at 1:30pm

## 2018-08-28 ENCOUNTER — Telehealth: Payer: Self-pay

## 2018-08-28 MED FILL — AZITHROMYCIN 250 MG TABLET: 250 | 5 days supply | Qty: 6 | Fill #0

## 2018-08-28 NOTE — Telephone Encounter (Signed)
  Follow up Call-  Call back number 08/27/2018 10/28/2016  Post procedure Call Back phone  # [phone]  Permission to leave phone message Yes Yes  Some recent data might be hidden     Patient questions:  Do you have a fever, pain , or abdominal swelling? No. Pain Score  0 *  Have you tolerated food without any problems? Yes.    Have you been able to return to your normal activities? Yes.    Do you have any questions about your discharge instructions: Diet   No. Medications  No. Follow up visit  No.  Do you have questions or concerns about your Care? No.  Actions: * If pain score is 4 or above: No action needed, pain <4.

## 2018-09-01 ENCOUNTER — Encounter: Payer: Self-pay | Admitting: Internal Medicine

## 2018-10-14 DIAGNOSIS — N9089 Other specified noninflammatory disorders of vulva and perineum: Secondary | ICD-10-CM | POA: Diagnosis not present

## 2018-10-14 DIAGNOSIS — D280 Benign neoplasm of vulva: Secondary | ICD-10-CM | POA: Diagnosis not present

## 2018-10-14 DIAGNOSIS — Z01419 Encounter for gynecological examination (general) (routine) without abnormal findings: Secondary | ICD-10-CM | POA: Diagnosis not present

## 2018-10-14 DIAGNOSIS — Z13820 Encounter for screening for osteoporosis: Secondary | ICD-10-CM | POA: Diagnosis not present

## 2018-10-14 DIAGNOSIS — Z6824 Body mass index (BMI) 24.0-24.9, adult: Secondary | ICD-10-CM | POA: Diagnosis not present

## 2018-11-11 DIAGNOSIS — D1801 Hemangioma of skin and subcutaneous tissue: Secondary | ICD-10-CM | POA: Diagnosis not present

## 2018-11-11 DIAGNOSIS — L570 Actinic keratosis: Secondary | ICD-10-CM | POA: Diagnosis not present

## 2018-11-11 DIAGNOSIS — L720 Epidermal cyst: Secondary | ICD-10-CM | POA: Diagnosis not present

## 2019-01-12 MED FILL — POTASSIUM CHLORIDE CRYS ER: 20 | 90 days supply | Qty: 90 | Fill #0 | Status: TO

## 2019-02-11 DIAGNOSIS — Z1231 Encounter for screening mammogram for malignant neoplasm of breast: Secondary | ICD-10-CM | POA: Diagnosis not present

## 2019-02-11 DIAGNOSIS — Z803 Family history of malignant neoplasm of breast: Secondary | ICD-10-CM | POA: Diagnosis not present

## 2019-02-15 DIAGNOSIS — L239 Allergic contact dermatitis, unspecified cause: Secondary | ICD-10-CM | POA: Diagnosis not present

## 2019-02-15 DIAGNOSIS — D225 Melanocytic nevi of trunk: Secondary | ICD-10-CM | POA: Diagnosis not present

## 2019-02-15 DIAGNOSIS — L570 Actinic keratosis: Secondary | ICD-10-CM | POA: Diagnosis not present

## 2019-02-15 MED FILL — predniSONE 10 MG TABS: 10 | 12 days supply | Qty: 30 | Fill #0

## 2019-02-15 MED FILL — TRIAMCINOLONE 0.1% CREAM: 0.1 | 14 days supply | Qty: 454 | Fill #0

## 2019-02-15 MED FILL — OMEPRAZOLE 20 MG CPDR: 20 | 90 days supply | Qty: 90 | Fill #0

## 2019-03-02 MED FILL — BETAMETHASONE DP 0.05% CRM: 0.05 | 30 days supply | Qty: 45 | Fill #0

## 2019-03-02 MED FILL — predniSONE 10 MG TABS: 10 | 8 days supply | Qty: 20 | Fill #0

## 2019-03-02 MED FILL — CEPHALEXIN 500 MG CAPSULE: 500 | 10 days supply | Qty: 20 | Fill #0

## 2019-03-25 MED FILL — POTASSIUM CHLORIDE CRYS ER: 20 | 90 days supply | Qty: 90 | Fill #0

## 2019-07-14 MED FILL — POTASSIUM CHLORIDE CRYS ER: 20 | 90 days supply | Qty: 90 | Fill #0

## 2019-08-10 DIAGNOSIS — L245 Irritant contact dermatitis due to other chemical products: Secondary | ICD-10-CM | POA: Diagnosis not present

## 2019-08-10 DIAGNOSIS — L570 Actinic keratosis: Secondary | ICD-10-CM | POA: Diagnosis not present

## 2019-08-10 DIAGNOSIS — L821 Other seborrheic keratosis: Secondary | ICD-10-CM | POA: Diagnosis not present

## 2019-09-08 DIAGNOSIS — H524 Presbyopia: Secondary | ICD-10-CM | POA: Diagnosis not present

## 2019-10-15 DIAGNOSIS — N819 Female genital prolapse, unspecified: Secondary | ICD-10-CM | POA: Diagnosis not present

## 2019-10-22 DIAGNOSIS — N819 Female genital prolapse, unspecified: Secondary | ICD-10-CM | POA: Diagnosis not present

## 2019-10-22 MED FILL — OMEPRAZOLE DR 20 MG CAPSULE: 20 | 90 days supply | Qty: 90 | Fill #1

## 2019-11-04 NOTE — Patient Instructions (Addendum)
YOU ARE SCHEDULED FOR A COVID TEST __02/25/2021_______@_____11 :50 am_______. THIS TEST MUST BE DONE BEFORE SURGERY. GO TO  [address], [place], [address] AND REMAIN IN YOUR CAR, THIS IS A DRIVE UP TEST. ONCE YOUR COVID TEST IS DONE PLEASE FOLLOW ALL THE QUARANTINE  INSTRUCTIONS GIVEN IN YOUR HANDOUT.      Your procedure is scheduled on  Monday March 1,2021   Report to [place] [facility]. M.   Call this number if you have problems the morning of surgery  :[phone].   OUR ADDRESS IS [place].  WE ARE LOCATED IN [facility].                                     REMEMBER:  DO NOT EAT FOOD OR DRINK LIQUIDS AFTER MIDNIGHT .     YOU MAY  BRUSH YOUR TEETH MORNING OF SURGERY AND RINSE YOUR MOUTH OUT, NO CHEWING GUM CANDY OR MINTS.    TAKE THESE MEDICATIONS MORNING OF SURGERY WITH A SIP OF WATER:  Omeprazole (Prilosec)   IF YOU ARE SPENDING THE NIGHT AFTER SURGERY PLEASE BRING ALL YOUR PRESCRIPTION MEDICATIONS IN THEIR ORIGINAL BOTTLES.   1 VISITOR IS ALLOWED IN WAITING ROOM ONLY DAY OF SURGERY.   NO VISITOR MAY SPEND THE NIGHT. VISITOR ARE ALLOWED TO STAY UNTIL 800 PM.                                    DO NOT WEAR JEWERLY, MAKE UP, OR NAIL POLISH ON FINGERNAILS. DO NOT WEAR LOTIONS, POWDERS, PERFUMES OR DEODORANT. DO NOT SHAVE FOR 24 HOURS PRIOR TO DAY OF SURGERY.  CONTACTS, GLASSES, OR DENTURES MAY NOT BE WORN TO SURGERY.                                    [facility] IS NOT RESPONSIBLE  FOR ANY BELONGINGS.                                                                    Marland Kitchen                                                                                                    [facility] - Preparing for Surgery Before surgery, you can play an important role.  Because skin is not sterile, your skin needs to be as free of germs as possible.  You can reduce the number of germs on your skin by washing with CHG (chlorahexidine  gluconate) soap before surgery.  CHG is an antiseptic  cleaner which kills germs and bonds with the skin to continue killing germs even after washing. Please DO NOT use if you have an allergy to CHG or antibacterial soaps.  If your skin becomes reddened/irritated stop using the CHG and inform your nurse when you arrive at Short Stay. Do not shave (including legs and underarms) for at least 48 hours prior to the first CHG shower.  You may shave your face/neck. Please follow these instructions carefully:  1.  Shower with CHG Soap the night before surgery and the  morning of Surgery.  2.  If you choose to wash your hair, wash your hair first as usual with your  normal  shampoo.  3.  After you shampoo, rinse your hair and body thoroughly to remove the  shampoo.                           4.  Use CHG as you would any other liquid soap.  You can apply chg directly  to the skin and wash                       Gently with a scrungie or clean washcloth.  5.  Apply the CHG Soap to your body ONLY FROM THE NECK DOWN.   Do not use on face/ open                           Wound or open sores. Avoid contact with eyes, ears mouth and genitals (private parts).                       Wash face,  Genitals (private parts) with your normal soap.             6.  Wash thoroughly, paying special attention to the area where your surgery  will be performed.  7.  Thoroughly rinse your body with warm water from the neck down.  8.  DO NOT shower/wash with your normal soap after using and rinsing off  the CHG Soap.                9.  Pat yourself dry with a clean towel.            10.  Wear clean pajamas.            11.  Place clean sheets on your bed the night of your first shower and do not  sleep with pets. Day of Surgery : Do not apply any lotions/deodorants the morning of surgery.  Please wear clean clothes to the hospital/surgery center.  FAILURE TO FOLLOW THESE INSTRUCTIONS MAY RESULT IN THE CANCELLATION OF YOUR  SURGERY PATIENT SIGNATURE_________________________________  NURSE SIGNATURE__________________________________  ________________________________________________________________________  WHAT IS A BLOOD TRANSFUSION? Blood Transfusion Information  A transfusion is the replacement of blood or some of its parts. Blood is made up of multiple cells which provide different functions.  Red blood cells carry oxygen and are used for blood loss replacement.  White blood cells fight against infection.  Platelets control bleeding.  Plasma helps clot blood.  Other blood products are available for specialized needs, such as hemophilia or other clotting disorders. BEFORE THE TRANSFUSION  Who gives blood for transfusions?   Healthy volunteers who are fully evaluated to make sure their blood is safe. This is blood bank blood. Transfusion therapy is the safest it has  ever been in the practice of medicine. Before blood is taken from a donor, a complete history is taken to make sure that person has no history of diseases nor engages in risky social behavior (examples are intravenous drug use or sexual activity with multiple partners). The donor's travel history is screened to minimize risk of transmitting infections, such as malaria. The donated blood is tested for signs of infectious diseases, such as HIV and hepatitis. The blood is then tested to be sure it is compatible with you in order to minimize the chance of a transfusion reaction. If you or a relative donates blood, this is often done in anticipation of surgery and is not appropriate for emergency situations. It takes many days to process the donated blood. RISKS AND COMPLICATIONS Although transfusion therapy is very safe and saves many lives, the main dangers of transfusion include:   Getting an infectious disease.  Developing a transfusion reaction. This is an allergic reaction to something in the blood you were given. Every precaution is taken to  prevent this. The decision to have a blood transfusion has been considered carefully by your caregiver before blood is given. Blood is not given unless the benefits outweigh the risks. AFTER THE TRANSFUSION  Right after receiving a blood transfusion, you will usually feel much better and more energetic. This is especially true if your red blood cells have gotten low (anemic). The transfusion raises the level of the red blood cells which carry oxygen, and this usually causes an energy increase.  The nurse administering the transfusion will monitor you carefully for complications. HOME CARE INSTRUCTIONS  No special instructions are needed after a transfusion. You may find your energy is better. Speak with your caregiver about any limitations on activity for underlying diseases you may have. SEEK MEDICAL CARE IF:   Your condition is not improving after your transfusion.  You develop redness or irritation at the intravenous (IV) site. SEEK IMMEDIATE MEDICAL CARE IF:  Any of the following symptoms occur over the next 12 hours:  Shaking chills.  You have a temperature by mouth above 102 F (38.9 C), not controlled by medicine.  Chest, back, or muscle pain.  People around you feel you are not acting correctly or are confused.  Shortness of breath or difficulty breathing.  Dizziness and fainting.  You get a rash or develop hives.  You have a decrease in urine output.  Your urine turns a dark color or changes to pink, red, or brown. Any of the following symptoms occur over the next 10 days:  You have a temperature by mouth above 102 F (38.9 C), not controlled by medicine.  Shortness of breath.  Weakness after normal activity.  The white part of the eye turns yellow (jaundice).  You have a decrease in the amount of urine or are urinating less often.  Your urine turns a dark color or changes to pink, red, or brown. Document Released: 08/30/2000 Document Revised: 11/25/2011  Document Reviewed: 04/18/2008 [facility] Patient Information 2014 [place], [place].  _______________________________________________________________________

## 2019-11-08 ENCOUNTER — Other Ambulatory Visit: Payer: Self-pay

## 2019-11-08 ENCOUNTER — Encounter (HOSPITAL_COMMUNITY): Payer: Self-pay

## 2019-11-08 ENCOUNTER — Encounter (HOSPITAL_COMMUNITY)
Admission: RE | Admit: 2019-11-08 | Discharge: 2019-11-08 | Disposition: A | Discharge status: Home / Self Care | Payer: 59 | Source: Ambulatory Visit | Attending: Obstetrics and Gynecology | Admitting: Obstetrics and Gynecology

## 2019-11-08 DIAGNOSIS — Z01812 Encounter for preprocedural laboratory examination: Secondary | ICD-10-CM | POA: Insufficient documentation

## 2019-11-08 LAB — CBC
HCT: 43.9 % (ref 36.0–46.0)
Hemoglobin: 14.8 g/dL (ref 12.0–15.0)
MCH: 29.3 pg (ref 26.0–34.0)
MCHC: 33.7 g/dL (ref 30.0–36.0)
MCV: 86.9 fL (ref 80.0–100.0)
Platelets: 295 10*3/uL (ref 150–400)
RBC: 5.05 MIL/uL (ref 3.87–5.11)
RDW: 12.6 % (ref 11.5–15.5)
WBC: 7.1 10*3/uL (ref 4.0–10.5)
nRBC: 0 % (ref 0.0–0.2)

## 2019-11-08 LAB — BASIC METABOLIC PANEL
Anion gap: 8 (ref 5–15)
BUN: 15 mg/dL (ref 6–20)
CO2: 28 mmol/L (ref 22–32)
Calcium: 9.7 mg/dL (ref 8.9–10.3)
Chloride: 105 mmol/L (ref 98–111)
Creatinine, Ser: 0.79 mg/dL (ref 0.44–1.00)
GFR calc Af Amer: >60 mL/min (ref >60)
GFR calc non Af Amer: >60 mL/min (ref >60)
Glucose, Bld: 106 mg/dL — ABNORMAL HIGH (ref 70–99)
Potassium: 4.4 mmol/L (ref 3.5–5.1)
Sodium: 141 mmol/L (ref 135–145)

## 2019-11-08 LAB — ABO/RH: ABO/RH(D): O POS

## 2019-11-08 NOTE — Progress Notes (Signed)
PCP - Dr. Burnard Bunting Cardiologist - Dr. Peter Martinique 01/07/2013 for palpitations epic, f/u prn  Chest x-ray - n/a EKG - n/a Stress Test - n/a ECHO -n/a  Cardiac Cath - n/a  Sleep Study - n/a CPAP - n/a  Fasting Blood Sugar - n/a Checks Blood Sugar __0___ times a day  Blood Thinner Instructions:n/a Aspirin Instructions:Stopped Ibuprofen many weeks ago! Last Dose:  Anesthesia review:   Patient has a history of cancerous polyp and palpitations.  Patient denies shortness of breath, fever, cough and chest pain at PAT appointment   Patient verbalized understanding of instructions that were given to them at the PAT appointment. Patient was also instructed that they will need to review over the PAT instructions again at home before surgery.

## 2019-11-10 NOTE — H&P (Signed)
59 year old female with symptomatic pelvic organ prolapse. Denies urinary incontinence.  Past Medical History:  Diagnosis Date  . Cyst of right kidney    PER CT REPORT 10/21/11  . Gallstones    SEEN ON CT REPORT 10/21/11  . GERD (gastroesophageal reflux disease)   . Pulmonary nodules    SEEN ON CT REPORT 10/21/11--AND NOTE FROM DR. PERRY THAT PT AND HUSBAND NOTIFIED--PT MAY NEED FOLLOW UP CT CHEST IN 6 MONTHS  . Rectal cancer in proximal rectal polyp s/p polypectomy 10/18/2011  . SVD (spontaneous vaginal delivery) x 3   Past Surgical History:  Procedure Laterality Date  . BIOPSY BREAST Left   . COLONOSCOPY  10/2016  . EUS  10/18/2011   Procedure: LOWER ENDOSCOPIC ULTRASOUND (EUS);  Surgeon: Owens Loffler, MD;  Location: [facility];  Service: Endoscopy;  Laterality: N/A;  tattoo,  moderate sedation, TR  . FOOT GANGLION EXCISION  2004 - approximate   right  . INGUINAL HERNIA REPAIR  2001   right  . MULTIPLE TOOTH EXTRACTIONS    . PARTIAL PROCTECTOMY  10/30/2011   TEM resection of polyp base, path = [id number]  . POLYPECTOMY    . PROCTOSCOPY  10/30/2011   Procedure: PROCTOSCOPY;  Surgeon: Adin Hector, MD;  Location: [facility];  Service: General;  Laterality: N/A;   Prior to Admission medications   Medication Sig Start Date End Date Taking? Authorizing Provider  Calcium Carb-Cholecalciferol (CALCIUM 600+D) 600-800 MG-UNIT TABS Take 1 tablet by mouth 3 (three) times a week.   Yes [provider]  EPINEPHrine (EPIPEN 2-PAK) 0.3 mg/0.3 mL IJ SOAJ injection Inject 0.3 mg into the muscle as needed for anaphylaxis.   Yes [provider]  ibuprofen (ADVIL) 200 MG tablet Take 600 mg by mouth every 8 (eight) hours as needed for headache or moderate pain.   Yes [provider]  Omega-3 Fatty Acids (FISH OIL) 1200 MG CAPS Take 2,400 mg by mouth 3 (three) times a week.   Yes [provider]  omeprazole (PRILOSEC) 20 MG capsule Take 20 mg by mouth daily.   Yes [provider]  potassium chloride (KLOR-CON) 20 MEQ packet Take 20 mEq by mouth 3 (three) times a week.    Yes [provider]   Family History  Problem Relation Age of Onset  . Dementia Father   . Colon cancer Neg Hx   . Esophageal cancer Neg Hx   . Rectal cancer Neg Hx   . Stomach cancer Neg Hx   . Colon polyps Neg Hx    Social History   Socioeconomic History  . Marital status: Married    Spouse name: Not on file  . Number of children: Not on file  . Years of education: Not on file  . Highest education level: Not on file  Occupational History  . Not on file  Tobacco Use  . Smoking status: Never Smoker  . Smokeless tobacco: Never Used  Substance and Sexual Activity  . Alcohol use: Yes    Alcohol/week: 3.0 - 4.0 standard drinks    Types: 3 - 4 Glasses of wine per week  . Drug use: No  . Sexual activity: Yes    Birth control/protection: Post-menopausal  Other Topics Concern  . Not on file  Social History Narrative   Spouse is Dr. Curt Jews, vascular surgeon   Social Determinants of Health   Financial Resource Strain:   . Difficulty of Paying Living Expenses: Not on file  Food Insecurity:   . Worried About Charity fundraiser in the Last Year: Not on file  . Ran Out of Food in the Last Year: Not on file  Transportation Needs:   . Lack of Transportation (Medical): Not on file  . Lack of Transportation (Non-Medical): Not on file  Physical Activity:   . Days of Exercise per Week: Not on file  . Minutes of Exercise per Session: Not on file  Stress:   . Feeling of Stress : Not on file  Social Connections:   . Frequency of Communication with Friends and Family: Not on file  . Frequency of Social Gatherings with Friends and Family: Not on file  . Attends Religious Services: Not on file  . Active Member of Clubs or Organizations: Not on file  . Attends Archivist Meetings: Not on file  . Marital Status: Not on file   There were no vitals taken  for this visit. General alert and oriented Lung CTAB Car RRR Abdomen is soft and non tender Pelvic Grade 2 cystocele Grade 2 uterine prolapse  No rectocele  IMPRESSION: Symptomatic pelvic organ prolapse   PLAN: LAVH, Anterior repair Risks reviewed Consent signed

## 2019-11-11 ENCOUNTER — Other Ambulatory Visit (HOSPITAL_COMMUNITY)
Admission: RE | Admit: 2019-11-11 | Discharge: 2019-11-11 | Disposition: A | Discharge status: Home / Self Care | Payer: 59 | Source: Ambulatory Visit | Attending: Obstetrics and Gynecology | Admitting: Obstetrics and Gynecology

## 2019-11-11 DIAGNOSIS — Z20822 Contact with and (suspected) exposure to covid-19: Secondary | ICD-10-CM | POA: Diagnosis not present

## 2019-11-11 DIAGNOSIS — Z01812 Encounter for preprocedural laboratory examination: Secondary | ICD-10-CM | POA: Diagnosis not present

## 2019-11-11 LAB — SARS CORONAVIRUS 2 (TAT 6-24 HRS): SARS Coronavirus 2: NEGATIVE

## 2019-11-15 ENCOUNTER — Encounter (HOSPITAL_BASED_OUTPATIENT_CLINIC_OR_DEPARTMENT_OTHER)
Admission: RE | Disposition: A | Discharge status: Home / Self Care | Payer: Self-pay | Source: Other Acute Inpatient Hospital | Attending: Obstetrics and Gynecology

## 2019-11-15 ENCOUNTER — Ambulatory Visit (HOSPITAL_BASED_OUTPATIENT_CLINIC_OR_DEPARTMENT_OTHER): Payer: 59 | Admitting: Physician Assistant

## 2019-11-15 ENCOUNTER — Encounter (HOSPITAL_BASED_OUTPATIENT_CLINIC_OR_DEPARTMENT_OTHER): Payer: Self-pay | Admitting: Obstetrics and Gynecology

## 2019-11-15 ENCOUNTER — Ambulatory Visit (HOSPITAL_BASED_OUTPATIENT_CLINIC_OR_DEPARTMENT_OTHER): Payer: 59 | Admitting: Anesthesiology

## 2019-11-15 ENCOUNTER — Ambulatory Visit (HOSPITAL_BASED_OUTPATIENT_CLINIC_OR_DEPARTMENT_OTHER)
Admission: RE | Admit: 2019-11-15 | Discharge: 2019-11-16 | Disposition: A | Discharge status: Home / Self Care | Payer: 59 | Source: Other Acute Inpatient Hospital | Attending: Obstetrics and Gynecology | Admitting: Obstetrics and Gynecology

## 2019-11-15 ENCOUNTER — Other Ambulatory Visit: Payer: Self-pay

## 2019-11-15 DIAGNOSIS — Z79899 Other long term (current) drug therapy: Secondary | ICD-10-CM | POA: Insufficient documentation

## 2019-11-15 DIAGNOSIS — Z85048 Personal history of other malignant neoplasm of rectum, rectosigmoid junction, and anus: Secondary | ICD-10-CM | POA: Insufficient documentation

## 2019-11-15 DIAGNOSIS — N8110 Cystocele, unspecified: Secondary | ICD-10-CM | POA: Diagnosis not present

## 2019-11-15 DIAGNOSIS — K219 Gastro-esophageal reflux disease without esophagitis: Secondary | ICD-10-CM | POA: Diagnosis not present

## 2019-11-15 DIAGNOSIS — N819 Female genital prolapse, unspecified: Secondary | ICD-10-CM | POA: Diagnosis not present

## 2019-11-15 DIAGNOSIS — N8189 Other female genital prolapse: Secondary | ICD-10-CM | POA: Insufficient documentation

## 2019-11-15 HISTORY — PX: LAPAROSCOPIC VAGINAL HYSTERECTOMY WITH SALPINGO OOPHORECTOMY: SHX6681

## 2019-11-15 HISTORY — PX: CYSTOCELE REPAIR: SHX163

## 2019-11-15 LAB — TYPE AND SCREEN
ABO/RH(D): O POS
Antibody Screen: NEGATIVE

## 2019-11-15 SURGERY — HYSTERECTOMY, VAGINAL, LAPAROSCOPY-ASSISTED, WITH SALPINGO-OOPHORECTOMY
Anesthesia: General | Site: Vagina

## 2019-11-15 MED ORDER — ROCURONIUM BROMIDE 10 MG/ML (PF) SYRINGE
PREFILLED_SYRINGE | INTRAVENOUS | Status: AC
  Filled 2019-11-15: qty 10

## 2019-11-15 MED ORDER — SODIUM CHLORIDE 0.9 % IV SOLN
INTRAVENOUS | Status: AC
  Filled 2019-11-15: qty 2

## 2019-11-15 MED ORDER — HYDROMORPHONE HCL 1 MG/ML IJ SOLN
0.2000 mg | INTRAMUSCULAR | Status: DC | PRN
  Filled 2019-11-15: qty 1

## 2019-11-15 MED ORDER — ONDANSETRON HCL 4 MG/2ML IJ SOLN
4.0000 mg | Freq: Four times a day (QID) | INTRAMUSCULAR | Status: DC | PRN
  Filled 2019-11-15: qty 2

## 2019-11-15 MED ORDER — MEPERIDINE HCL 25 MG/ML IJ SOLN
6.2500 mg | INTRAMUSCULAR | Status: DC | PRN
  Filled 2019-11-15: qty 1

## 2019-11-15 MED ORDER — BUPIVACAINE HCL (PF) 0.25 % IJ SOLN
INTRAMUSCULAR | Status: DC | PRN
  Administered 2019-11-15: 4 mL

## 2019-11-15 MED ORDER — DEXAMETHASONE SODIUM PHOSPHATE 10 MG/ML IJ SOLN
INTRAMUSCULAR | Status: DC | PRN
  Administered 2019-11-15: 8 mg via INTRAVENOUS

## 2019-11-15 MED ORDER — DOCUSATE SODIUM 100 MG PO CAPS
ORAL_CAPSULE | ORAL | Status: AC
  Filled 2019-11-15: qty 1

## 2019-11-15 MED ORDER — SIMETHICONE 80 MG PO CHEW
80.0000 mg | CHEWABLE_TABLET | Freq: Four times a day (QID) | ORAL | Status: DC | PRN
  Filled 2019-11-15: qty 1

## 2019-11-15 MED ORDER — LIDOCAINE 2% (20 MG/ML) 5 ML SYRINGE
INTRAMUSCULAR | Status: DC | PRN
  Administered 2019-11-15: 100 mg via INTRAVENOUS
  Administered 2019-11-15: 1.5 mg/kg/h via INTRAVENOUS

## 2019-11-15 MED ORDER — MENTHOL 3 MG MT LOZG
1.0000 | LOZENGE | OROMUCOSAL | Status: DC | PRN
  Filled 2019-11-15: qty 9

## 2019-11-15 MED ORDER — IBUPROFEN 200 MG PO TABS
ORAL_TABLET | ORAL | Status: AC
  Filled 2019-11-15: qty 3

## 2019-11-15 MED ORDER — LACTATED RINGERS IV SOLN
INTRAVENOUS | Status: DC
  Filled 2019-11-15: qty 1000

## 2019-11-15 MED ORDER — ROCURONIUM BROMIDE 10 MG/ML (PF) SYRINGE
PREFILLED_SYRINGE | INTRAVENOUS | Status: DC | PRN
  Administered 2019-11-15: 60 mg via INTRAVENOUS
  Administered 2019-11-15: 5 mg via INTRAVENOUS
  Administered 2019-11-15: 10 mg via INTRAVENOUS

## 2019-11-15 MED ORDER — ONDANSETRON HCL 4 MG/2ML IJ SOLN
4.0000 mg | Freq: Once | INTRAMUSCULAR | Status: DC | PRN
  Filled 2019-11-15: qty 2

## 2019-11-15 MED ORDER — ONDANSETRON HCL 4 MG/2ML IJ SOLN
INTRAMUSCULAR | Status: DC | PRN
  Administered 2019-11-15 (×2): 4 mg via INTRAVENOUS

## 2019-11-15 MED ORDER — PROPOFOL 10 MG/ML IV BOLUS
INTRAVENOUS | Status: AC
  Filled 2019-11-15: qty 40

## 2019-11-15 MED ORDER — OXYCODONE HCL 5 MG PO TABS
ORAL_TABLET | ORAL | Status: AC
  Filled 2019-11-15: qty 2

## 2019-11-15 MED ORDER — IBUPROFEN 600 MG PO TABS
600.0000 mg | ORAL_TABLET | Freq: Four times a day (QID) | ORAL | Status: DC | PRN
  Administered 2019-11-15 – 2019-11-16 (×2): 600 mg via ORAL
  Filled 2019-11-15: qty 1

## 2019-11-15 MED ORDER — SODIUM CHLORIDE 0.9 % IV SOLN
2.0000 g | INTRAVENOUS | Status: AC
  Administered 2019-11-15: 2 g via INTRAVENOUS
  Filled 2019-11-15: qty 2

## 2019-11-15 MED ORDER — LIDOCAINE 2% (20 MG/ML) 5 ML SYRINGE
INTRAMUSCULAR | Status: AC
  Filled 2019-11-15: qty 15

## 2019-11-15 MED ORDER — ACETAMINOPHEN 10 MG/ML IV SOLN
INTRAVENOUS | Status: DC | PRN
  Administered 2019-11-15: 1000 mg via INTRAVENOUS

## 2019-11-15 MED ORDER — DIPHENHYDRAMINE HCL 50 MG/ML IJ SOLN
INTRAMUSCULAR | Status: AC
  Filled 2019-11-15: qty 1

## 2019-11-15 MED ORDER — DIPHENHYDRAMINE HCL 50 MG/ML IJ SOLN
INTRAMUSCULAR | Status: DC | PRN
  Administered 2019-11-15: 6.25 mg via INTRAVENOUS

## 2019-11-15 MED ORDER — KETOROLAC TROMETHAMINE 30 MG/ML IJ SOLN
INTRAMUSCULAR | Status: AC
  Filled 2019-11-15: qty 1

## 2019-11-15 MED ORDER — KETOROLAC TROMETHAMINE 30 MG/ML IJ SOLN
30.0000 mg | Freq: Once | INTRAMUSCULAR | Status: DC
  Filled 2019-11-15: qty 1

## 2019-11-15 MED ORDER — HYDROMORPHONE HCL 1 MG/ML IJ SOLN
0.2500 mg | INTRAMUSCULAR | Status: DC | PRN
  Administered 2019-11-15 (×2): 0.5 mg via INTRAVENOUS
  Filled 2019-11-15: qty 0.5

## 2019-11-15 MED ORDER — ACETAMINOPHEN 10 MG/ML IV SOLN
INTRAVENOUS | Status: AC
  Filled 2019-11-15: qty 100

## 2019-11-15 MED ORDER — LACTATED RINGERS IV SOLN
INTRAVENOUS | Status: DC
  Filled 2019-11-15 (×2): qty 1000

## 2019-11-15 MED ORDER — ONDANSETRON HCL 4 MG/2ML IJ SOLN
INTRAMUSCULAR | Status: AC
  Filled 2019-11-15: qty 2

## 2019-11-15 MED ORDER — FENTANYL CITRATE (PF) 250 MCG/5ML IJ SOLN
INTRAMUSCULAR | Status: AC
  Filled 2019-11-15: qty 5

## 2019-11-15 MED ORDER — FENTANYL CITRATE (PF) 100 MCG/2ML IJ SOLN
INTRAMUSCULAR | Status: DC | PRN
  Administered 2019-11-15 (×2): 50 ug via INTRAVENOUS
  Administered 2019-11-15: 100 ug via INTRAVENOUS

## 2019-11-15 MED ORDER — TRAMADOL HCL 50 MG PO TABS
50.0000 mg | ORAL_TABLET | Freq: Four times a day (QID) | ORAL | Status: DC | PRN
  Filled 2019-11-15: qty 1

## 2019-11-15 MED ORDER — HYDROMORPHONE HCL 1 MG/ML IJ SOLN
INTRAMUSCULAR | Status: AC
  Filled 2019-11-15: qty 1

## 2019-11-15 MED ORDER — ARTIFICIAL TEARS OPHTHALMIC OINT
TOPICAL_OINTMENT | OPHTHALMIC | Status: AC
  Filled 2019-11-15: qty 3.5

## 2019-11-15 MED ORDER — PROPOFOL 10 MG/ML IV BOLUS
INTRAVENOUS | Status: DC | PRN
  Administered 2019-11-15: 120 mg via INTRAVENOUS

## 2019-11-15 MED ORDER — SUGAMMADEX SODIUM 200 MG/2ML IV SOLN
INTRAVENOUS | Status: DC | PRN
  Administered 2019-11-15: 200 mg via INTRAVENOUS

## 2019-11-15 MED ORDER — ONDANSETRON HCL 4 MG PO TABS
4.0000 mg | ORAL_TABLET | Freq: Four times a day (QID) | ORAL | Status: DC | PRN
  Filled 2019-11-15: qty 1

## 2019-11-15 MED ORDER — LIDOCAINE 2% (20 MG/ML) 5 ML SYRINGE
INTRAMUSCULAR | Status: AC
  Filled 2019-11-15: qty 5

## 2019-11-15 MED ORDER — KETOROLAC TROMETHAMINE 30 MG/ML IJ SOLN
INTRAMUSCULAR | Status: DC | PRN
  Administered 2019-11-15: 30 mg via INTRAVENOUS

## 2019-11-15 MED ORDER — OXYCODONE HCL 5 MG PO TABS
5.0000 mg | ORAL_TABLET | ORAL | Status: DC | PRN
  Administered 2019-11-15 – 2019-11-16 (×4): 10 mg via ORAL
  Filled 2019-11-15: qty 2

## 2019-11-15 MED ORDER — DEXAMETHASONE SODIUM PHOSPHATE 10 MG/ML IJ SOLN
INTRAMUSCULAR | Status: AC
  Filled 2019-11-15: qty 1

## 2019-11-15 MED ORDER — MIDAZOLAM HCL 2 MG/2ML IJ SOLN
INTRAMUSCULAR | Status: AC
  Filled 2019-11-15: qty 2

## 2019-11-15 MED ORDER — MIDAZOLAM HCL 2 MG/2ML IJ SOLN
INTRAMUSCULAR | Status: DC | PRN
  Administered 2019-11-15: 2 mg via INTRAVENOUS

## 2019-11-15 MED ORDER — EPHEDRINE 5 MG/ML INJ
INTRAVENOUS | Status: AC
  Filled 2019-11-15: qty 10

## 2019-11-15 MED ORDER — DOCUSATE SODIUM 100 MG PO CAPS
100.0000 mg | ORAL_CAPSULE | Freq: Two times a day (BID) | ORAL | Status: DC
  Administered 2019-11-15 (×2): 100 mg via ORAL
  Filled 2019-11-15: qty 1

## 2019-11-15 MED ORDER — EPHEDRINE SULFATE-NACL 50-0.9 MG/10ML-% IV SOSY
PREFILLED_SYRINGE | INTRAVENOUS | Status: DC | PRN
  Administered 2019-11-15: 10 mg via INTRAVENOUS

## 2019-11-15 SURGICAL SUPPLY — 77 items
ADH SKN CLS APL DERMABOND .7 (GAUZE/BANDAGES/DRESSINGS) ×2
APL SRG 38 LTWT LNG FL B (MISCELLANEOUS)
APPLICATOR ARISTA FLEXITIP XL (MISCELLANEOUS) IMPLANT
BAG DRN RND TRDRP ANRFLXCHMBR (UROLOGICAL SUPPLIES)
BAG URINE DRAIN 2000ML AR STRL (UROLOGICAL SUPPLIES) ×2 IMPLANT
BARRIER ADHS 3X4 INTERCEED (GAUZE/BANDAGES/DRESSINGS) IMPLANT
BLADE CLIPPER SENSICLIP SURGIC (BLADE) IMPLANT
BLADE SURG 15 STRL LF DISP TIS (BLADE) ×2 IMPLANT
BLADE SURG 15 STRL SS (BLADE) ×4
BRR ADH 4X3 ABS CNTRL BYND (GAUZE/BANDAGES/DRESSINGS)
CANISTER SUCT 3000ML PPV (MISCELLANEOUS) IMPLANT
COVER BACK TABLE 60X90IN (DRAPES) ×4 IMPLANT
COVER MAYO STAND STRL (DRAPES) ×8 IMPLANT
COVER WAND RF STERILE (DRAPES) ×6 IMPLANT
DERMABOND ADVANCED (GAUZE/BANDAGES/DRESSINGS) ×2
DERMABOND ADVANCED .7 DNX12 (GAUZE/BANDAGES/DRESSINGS) ×2 IMPLANT
DRAPE SHEET LG 3/4 BI-LAMINATE (DRAPES) ×4 IMPLANT
DRSG COVADERM PLUS 2X2 (GAUZE/BANDAGES/DRESSINGS) IMPLANT
DRSG OPSITE POSTOP 3X4 (GAUZE/BANDAGES/DRESSINGS) ×4 IMPLANT
DURAPREP 26ML APPLICATOR (WOUND CARE) ×4 IMPLANT
ELECT REM PT RETURN 9FT ADLT (ELECTROSURGICAL) ×4
ELECTRODE REM PT RTRN 9FT ADLT (ELECTROSURGICAL) ×2 IMPLANT
GAUZE 4X4 16PLY RFD (DISPOSABLE) ×4 IMPLANT
GAUZE PACKING IODOFORM 2 (PACKING) IMPLANT
GLOVE BIO SURGEON STRL SZ 6.5 (GLOVE) ×11 IMPLANT
GLOVE BIO SURGEON STRL SZ7 (GLOVE) ×2 IMPLANT
GLOVE BIO SURGEON STRL SZ7.5 (GLOVE) ×2 IMPLANT
GLOVE BIO SURGEONS STRL SZ 6.5 (GLOVE) ×5
GLOVE BIOGEL PI IND STRL 7.0 (GLOVE) IMPLANT
GLOVE BIOGEL PI IND STRL 7.5 (GLOVE) IMPLANT
GLOVE BIOGEL PI INDICATOR 7.0 (GLOVE) ×4
GLOVE BIOGEL PI INDICATOR 7.5 (GLOVE) ×2
GLOVE ECLIPSE 6.5 STRL STRAW (GLOVE) ×11 IMPLANT
GLOVE INDICATOR 6.5 STRL GRN (GLOVE) ×4 IMPLANT
GOWN STRL REUS W/TWL LRG LVL3 (GOWN DISPOSABLE) ×4 IMPLANT
HEMOSTAT ARISTA ABSORB 3G PWDR (HEMOSTASIS) IMPLANT
HOLDER FOLEY CATH W/STRAP (MISCELLANEOUS) ×4 IMPLANT
KIT TURNOVER CYSTO (KITS) ×4 IMPLANT
NDL HYPO 25X1 1.5 SAFETY (NEEDLE) ×2 IMPLANT
NDL SPNL 18GX3.5 QUINCKE PK (NEEDLE) IMPLANT
NEEDLE HYPO 25X1 1.5 SAFETY (NEEDLE) ×4 IMPLANT
NEEDLE INSUFFLATION 120MM (ENDOMECHANICALS) ×4 IMPLANT
NEEDLE SPNL 18GX3.5 QUINCKE PK (NEEDLE) ×4 IMPLANT
NS IRRIG 1000ML POUR BTL (IV SOLUTION) ×2 IMPLANT
NS IRRIG 500ML POUR BTL (IV SOLUTION) ×2 IMPLANT
PACK LAVH (CUSTOM PROCEDURE TRAY) ×4 IMPLANT
PACK ROBOTIC GOWN (GOWN DISPOSABLE) ×4 IMPLANT
PACK TRENDGUARD 450 HYBRID PRO (MISCELLANEOUS) IMPLANT
PACK VAGINAL MINOR WOMEN LF (CUSTOM PROCEDURE TRAY) ×4 IMPLANT
PACK VAGINAL WOMENS (CUSTOM PROCEDURE TRAY) ×4 IMPLANT
PAD OB MATERNITY 4.3X12.25 (PERSONAL CARE ITEMS) ×4 IMPLANT
PAD PREP 24X48 CUFFED NSTRL (MISCELLANEOUS) ×4 IMPLANT
SEALER TISSUE G2 CVD JAW 45CM (ENDOMECHANICALS) ×4 IMPLANT
SET IRRIG TUBING LAPAROSCOPIC (IRRIGATION / IRRIGATOR) ×4 IMPLANT
SET TUBE SMOKE EVAC HIGH FLOW (TUBING) ×4 IMPLANT
SURGILUBE 2OZ TUBE FLIPTOP (MISCELLANEOUS) ×4 IMPLANT
SUT PROLENE 1 CT 1 30 (SUTURE) IMPLANT
SUT VIC AB 0 CT1 18XCR BRD8 (SUTURE) ×4 IMPLANT
SUT VIC AB 0 CT1 36 (SUTURE) ×10 IMPLANT
SUT VIC AB 0 CT1 8-18 (SUTURE) ×8
SUT VIC AB 2-0 UR5 27 (SUTURE) ×4 IMPLANT
SUT VIC AB 3-0 PS2 18 (SUTURE) ×4
SUT VIC AB 3-0 PS2 18XBRD (SUTURE) ×2 IMPLANT
SUT VIC AB 3-0 SH 27 (SUTURE)
SUT VIC AB 3-0 SH 27X BRD (SUTURE) IMPLANT
SUT VICRYL 0 TIES 12 18 (SUTURE) ×4 IMPLANT
SUT VICRYL 0 UR6 27IN ABS (SUTURE) ×4 IMPLANT
SYR BULB IRRIGATION 50ML (SYRINGE) IMPLANT
SYR CONTROL 10ML LL (SYRINGE) ×2 IMPLANT
TOWEL OR 17X26 10 PK STRL BLUE (TOWEL DISPOSABLE) ×8 IMPLANT
TRAY FOLEY W/BAG SLVR 14FR (SET/KITS/TRAYS/PACK) ×4 IMPLANT
TRENDGUARD 450 HYBRID PRO PACK (MISCELLANEOUS) ×4
TROCAR OPTI TIP 5M 100M (ENDOMECHANICALS) ×4 IMPLANT
TROCAR XCEL BLUNT TIP 100MML (ENDOMECHANICALS) IMPLANT
TROCAR XCEL DIL TIP R 11M (ENDOMECHANICALS) ×4 IMPLANT
WARMER LAPAROSCOPE (MISCELLANEOUS) ×4 IMPLANT
WATER STERILE IRR 500ML POUR (IV SOLUTION) ×4 IMPLANT

## 2019-11-15 NOTE — Brief Op Note (Signed)
11/15/2019  9:13 AM  PATIENT:  Cheryl Holland  59 y.o. female  PRE-OPERATIVE DIAGNOSIS:   Pelvic organ prolapse  POST-OPERATIVE DIAGNOSIS:   Same  PROCEDURE:  Procedure(s): LAPAROSCOPIC ASSISTED VAGINAL HYSTERECTOMY WITH SALPINGO OOPHORECTOMY (Bilateral) ANTERIOR REPAIR (CYSTOCELE) (N/A)  SURGEON:  Surgeon(s) and Role:    * Dian Queen, MD - Primary    * Arvella Nigh, MD - Assisting  PHYSICIAN ASSISTANT:   ASSISTANTS: none   ANESTHESIA:   general  EBL:  25 mL   BLOOD ADMINISTERED:none  DRAINS: Urinary Catheter (Foley)   LOCAL MEDICATIONS USED:  LIDOCAINE   SPECIMEN:  Source of Specimen:  cervix, uterus, tubes and ovaries  DISPOSITION OF SPECIMEN:  PATHOLOGY  COUNTS:  YES  TOURNIQUET:  * No tourniquets in log *  DICTATION: .Other Dictation: Dictation Number dictated  PLAN OF CARE: Admit for overnight observation  PATIENT DISPOSITION:  PACU - hemodynamically stable.   Delay start of Pharmacological VTE agent (>24hrs) due to surgical blood loss or risk of bleeding: not applicable

## 2019-11-15 NOTE — Anesthesia Preprocedure Evaluation (Signed)
Anesthesia Evaluation  Patient identified by MRN, date of birth, ID band Patient awake    Reviewed: Allergy & Precautions, NPO status , Patient's Chart, lab work & pertinent test results  Airway Mallampati: I  TM Distance: >3 FB Neck ROM: Full    Dental   Pulmonary    Pulmonary exam normal        Cardiovascular Normal cardiovascular exam     Neuro/Psych    GI/Hepatic GERD  Medicated and Controlled,  Endo/Other    Renal/GU      Musculoskeletal   Abdominal   Peds  Hematology   Anesthesia Other Findings   Reproductive/Obstetrics                             Anesthesia Physical Anesthesia Plan  ASA: II  Anesthesia Plan: General   Post-op Pain Management:    Induction: Intravenous  PONV Risk Score and Plan: 3 and Midazolam, Dexamethasone and Ondansetron  Airway Management Planned: Oral ETT  Additional Equipment:   Intra-op Plan:   Post-operative Plan: Extubation in OR  Informed Consent: I have reviewed the patients History and Physical, chart, labs and discussed the procedure including the risks, benefits and alternatives for the proposed anesthesia with the patient or authorized representative who has indicated his/her understanding and acceptance.       Plan Discussed with: CRNA and Surgeon  Anesthesia Plan Comments:         Anesthesia Quick Evaluation

## 2019-11-15 NOTE — Progress Notes (Signed)
H and P on the chart No changes Will proceed with LAVH, BSO and Anterior Repair No results found for this or any previous visit (from the past 24 hour(s)). BP 129/75   Pulse 71   Temp 97.9 F (36.6 C) (Oral)   Resp 16   Ht 5' 2.5" (1.588 m)   Wt 59.9 kg   SpO2 100%   BMI 23.76 kg/m

## 2019-11-15 NOTE — Transfer of Care (Signed)
    Last Vitals:  Vitals Value Taken Time  BP 133/73 11/15/19 0920  Temp    Pulse 71 11/15/19 0925  Resp 12 11/15/19 0925  SpO2 100 % 11/15/19 0925  Vitals shown include unvalidated device data.  Last Pain:  Vitals:   11/15/19 0535  TempSrc: Oral         Immediate Anesthesia Transfer of Care Note  Patient: Cheryl Holland  Procedure(s) Performed: Procedure(s) (LRB): LAPAROSCOPIC ASSISTED VAGINAL HYSTERECTOMY WITH SALPINGO OOPHORECTOMY (Bilateral) ANTERIOR REPAIR (CYSTOCELE) (N/A)  Patient Location: PACU  Anesthesia Type: General  Level of Consciousness: awake, alert  and oriented  Airway & Oxygen Therapy: Patient Spontanous Breathing and Patient connected to nasal cannula oxygen  Post-op Assessment: Report given to PACU RN and Post -op Vital signs reviewed and stable  Post vital signs: Reviewed and stable  Complications: No apparent anesthesia complications

## 2019-11-15 NOTE — Anesthesia Procedure Notes (Signed)
Procedure Name: Intubation Date/Time: 11/15/2019 7:36 AM Performed by: Mechele Claude, CRNA Pre-anesthesia Checklist: Patient identified, Emergency Drugs available, Suction available and Patient being monitored Patient Re-evaluated:Patient Re-evaluated prior to induction Oxygen Delivery Method: Circle system utilized Preoxygenation: Pre-oxygenation with 100% oxygen Induction Type: IV induction Ventilation: Mask ventilation without difficulty Laryngoscope Size: Mac and 3 Grade View: Grade I Tube type: Oral Tube size: 7.0 mm Number of attempts: 1 Airway Equipment and Method: Stylet and Oral airway Placement Confirmation: ETT inserted through vocal cords under direct vision,  positive ETCO2 and breath sounds checked- equal and bilateral Secured at: 21 cm Tube secured with: Tape Dental Injury: Teeth and Oropharynx as per pre-operative assessment

## 2019-11-15 NOTE — Anesthesia Postprocedure Evaluation (Signed)
Anesthesia Post Note  Patient: Cheryl Holland  Procedure(s) Performed: LAPAROSCOPIC ASSISTED VAGINAL HYSTERECTOMY WITH SALPINGO OOPHORECTOMY (Bilateral Abdomen) ANTERIOR REPAIR (CYSTOCELE) (N/A Vagina )     Patient location during evaluation: PACU Anesthesia Type: General Level of consciousness: awake and alert Pain management: pain level controlled Vital Signs Assessment: post-procedure vital signs reviewed and stable Respiratory status: spontaneous breathing, nonlabored ventilation, respiratory function stable and patient connected to nasal cannula oxygen Cardiovascular status: blood pressure returned to baseline and stable Postop Assessment: no apparent nausea or vomiting Anesthetic complications: no    Last Vitals:  Vitals:   11/15/19 1100 11/15/19 1200  BP: 118/71 101/88  Pulse: 77 85  Resp: 14 16  Temp: 36.4 C (!) 36.2 C  SpO2: 97% 98%    Last Pain:  Vitals:   11/15/19 1100  TempSrc:   PainSc: 3                  Ceazia Harb DAVID

## 2019-11-16 DIAGNOSIS — N8189 Other female genital prolapse: Secondary | ICD-10-CM | POA: Diagnosis not present

## 2019-11-16 DIAGNOSIS — N8110 Cystocele, unspecified: Secondary | ICD-10-CM | POA: Diagnosis not present

## 2019-11-16 DIAGNOSIS — Z79899 Other long term (current) drug therapy: Secondary | ICD-10-CM | POA: Diagnosis not present

## 2019-11-16 DIAGNOSIS — Z85048 Personal history of other malignant neoplasm of rectum, rectosigmoid junction, and anus: Secondary | ICD-10-CM | POA: Diagnosis not present

## 2019-11-16 DIAGNOSIS — K219 Gastro-esophageal reflux disease without esophagitis: Secondary | ICD-10-CM | POA: Diagnosis not present

## 2019-11-16 LAB — CBC
HCT: 36.8 % (ref 36.0–46.0)
Hemoglobin: 11.9 g/dL — ABNORMAL LOW (ref 12.0–15.0)
MCH: 28.6 pg (ref 26.0–34.0)
MCHC: 32.3 g/dL (ref 30.0–36.0)
MCV: 88.5 fL (ref 80.0–100.0)
Platelets: 255 10*3/uL (ref 150–400)
RBC: 4.16 MIL/uL (ref 3.87–5.11)
RDW: 12.8 % (ref 11.5–15.5)
WBC: 10.1 10*3/uL (ref 4.0–10.5)
nRBC: 0 % (ref 0.0–0.2)

## 2019-11-16 LAB — SURGICAL PATHOLOGY

## 2019-11-16 MED ORDER — OXYCODONE HCL 5 MG PO TABS
ORAL_TABLET | ORAL | Status: AC
  Filled 2019-11-16: qty 2

## 2019-11-16 MED ORDER — OXYCODONE HCL 5 MG PO TABS: 5.0000 mg | ORAL_TABLET | ORAL | 0 refills | Status: DC | PRN

## 2019-11-16 MED ORDER — IBUPROFEN 200 MG PO TABS
ORAL_TABLET | ORAL | Status: AC
  Filled 2019-11-16: qty 3

## 2019-11-16 MED FILL — oxyCODONE HCL 5 MG TABS: 5 | 3 days supply | Qty: 30 | Fill #0

## 2019-11-16 NOTE — Discharge Instructions (Signed)
Laparoscopically Assisted Vaginal Hysterectomy, Care After This sheet gives you information about how to care for yourself after your procedure. Your health care provider may also give you more specific instructions. If you have problems or questions, contact your health care provider. What can I expect after the procedure? After the procedure, it is common to have:  Soreness and numbness in your incision areas.  Abdominal pain. You will be given pain medicine to control it.  Vaginal bleeding and discharge. You will need to use a sanitary napkin after this procedure.  Sore throat from the breathing tube that was inserted during surgery. Follow these instructions at home: Medicines  Take over-the-counter and prescription medicines only as told by your health care provider.  Do not take aspirin or ibuprofen. These medicines can cause bleeding.  Do not drive or use heavy machinery while taking prescription pain medicine.  Do not drive for 24 hours if you were given a medicine to help you relax (sedative) during the procedure. Incision care   Follow instructions from your health care provider about how to take care of your incisions. Make sure you: ? Wash your hands with soap and water before you change your bandage (dressing). If soap and water are not available, use hand sanitizer. ? Change your dressing as told by your health care provider. ? Leave stitches (sutures), skin glue, or adhesive strips in place. These skin closures may need to stay in place for 2 weeks or longer. If adhesive strip edges start to loosen and curl up, you may trim the loose edges. Do not remove adhesive strips completely unless your health care provider tells you to do that.  Check your incision area every day for signs of infection. Check for: ? Redness, swelling, or pain. ? Fluid or blood. ? Warmth. ? Pus or a bad smell. Activity  Get regular exercise as told by your health care provider. You may be  told to take short walks every day and go farther each time.  Return to your normal activities as told by your health care provider. Ask your health care provider what activities are safe for you.  Do not douche, use tampons, or have sexual intercourse for at least 6 weeks, or until your health care provider gives you permission.  Do not lift anything that is heavier than 10 lb (4.5 kg), or the limit that your health care provider tells you, until he or she says that it is safe. General instructions  Do not take baths, swim, or use a hot tub until your health care provider approves. Take showers instead of baths.  Do not drive for 24 hours if you received a sedative.  Do not drive or operate heavy machinery while taking prescription pain medicine.  To prevent or treat constipation while you are taking prescription pain medicine, your health care provider may recommend that you: ? Drink enough fluid to keep your urine clear or pale yellow. ? Take over-the-counter or prescription medicines. ? Eat foods that are high in fiber, such as fresh fruits and vegetables, whole grains, and beans. ? Limit foods that are high in fat and processed sugars, such as fried and sweet foods.  Keep all follow-up visits as told by your health care provider. This is important. Contact a health care provider if:  You have signs of infection, such as: ? Redness, swelling, or pain around your incision sites. ? Fluid or blood coming from an incision. ? An incision that feels warm to the   touch. ? Pus or a bad smell coming from an incision.  Your incision breaks open.  Your pain medicine is not helping.  You feel dizzy or light-headed.  You have pain or bleeding when you urinate.  You have persistent nausea and vomiting.  You have blood, pus, or a bad-smelling discharge from your vagina. Get help right away if:  You have a fever.  You have severe abdominal pain.  You have chest pain.  You have  shortness of breath.  You faint.  You have pain, swelling, or redness in your leg.  You have heavy bleeding from your vagina. Summary  After the procedure, it is common to have abdominal pain and vaginal bleeding.  You should not drive or lift heavy objects until your health care provider says that it is safe.  Contact your health care provider if you have any symptoms of infection, excessive vaginal bleeding, nausea, vomiting, or shortness of breath. This information is not intended to replace advice given to you by your health care provider. Make sure you discuss any questions you have with your health care provider. Document Revised: 08/15/2017 Document Reviewed: 10/29/2016 Elsevier Patient Education  2020 [employer] Inc[employer]  

## 2019-11-16 NOTE — Discharge Summary (Signed)
Admission Diagnosis: Pelvic Organ Prolapse  Discharge Diagnosis: Same  Hospital Course: 59 year old female admitted for LAVH, BSO,Anterior repair. She did very well post op  She was ambulating, voiding and tolerating regular diet on POD # 1   BP 109/64 (BP Location: Left Arm)   Pulse 82   Temp 98.2 F (36.8 C)   Resp 16   Ht 5' 2.5" (1.588 m)   Wt 59.9 kg   SpO2 98%   BMI 23.76 kg/m  Results for orders placed or performed during the hospital encounter of 11/15/19 (from the past 24 hour(s))  CBC     Status: Abnormal   Collection Time: 11/16/19  5:23 AM  Result Value Ref Range   WBC 10.1 4.0 - 10.5 K/uL   RBC 4.16 3.87 - 5.11 MIL/uL   Hemoglobin 11.9 (L) 12.0 - 15.0 g/dL   HCT 36.8 36.0 - 46.0 %   MCV 88.5 80.0 - 100.0 fL   MCH 28.6 26.0 - 34.0 pg   MCHC 32.3 30.0 - 36.0 g/dL   RDW 12.8 11.5 - 15.5 %   Platelets 255 150 - 400 K/uL   nRBC 0.0 0.0 - 0.2 %   Abdomen is soft and non tender Incision is clean and dry and intact  Discharged home in good condition Follow up in 1 week Rx Ibuprofen and Oxycodone No driving x 1 week

## 2019-11-22 NOTE — Op Note (Signed)
NAME: Cheryl Holland, Cheryl Holland MEDICAL RECORD [id number] ACCOUNT [id number] DATE OF BIRTH:04/14/1961 FACILITY: [facility] LOCATION: [facility] PHYSICIAN:Bryant Lipps Lynett Fish, MD  OPERATIVE REPORT  DATE OF PROCEDURE:  11/15/2019  PREOPERATIVE DIAGNOSIS:  Pelvic organ prolapse, symptomatic.  POSTOPERATIVE DIAGNOSIS:  Pelvic organ prolapse, symptomatic.  PROCEDURES:   1.  Laparoscopic-assisted vaginal hysterectomy with salpingo-oophorectomy bilaterally.   2.  Anterior colporrhaphy.  SURGEON:  Dian Queen, MD  ASSISTANT:  Arvella Nigh, MD   ESTIMATED BLOOD LOSS:  25 mL.  ANESTHESIA:  General anesthesia.  DRAINS:  Foley catheter.  PATHOLOGY:  Cervix, uterus, tubes, and ovaries sent to pathology.  COMPLICATIONS:  None.  DESCRIPTION OF PROCEDURE:  The patient was taken to the operating room.  She was intubated without any difficulty.  She was then prepped and draped in the usual sterile fashion.  A Foley catheter and uterine manipulator were inserted.  Attention was then  turned to the abdomen where a small infraumbilical incision was made and the Veress needle was inserted and pneumoperitoneum was performed in standard fashion.  The trocar was inserted and the patient was placed in Trendelenburg position and a 5 mm  trocar was inserted suprapubically.  Inspection of the pelvis revealed the following:  Normal uterus, adnexa, and ovaries.  The EnSeal was then placed across the IP ligament on the right side with careful attention to stay snug beneath the ovary and this  was cauterized and transected all the way down to the round ligament with excellent hemostasis.  This was done on the left side as well.  We then released the pneumoperitoneum, went down to the vagina, placed a weighted speculum into the vagina, made a  circumferential incision around the cervix and then entered the posterior and anterior cul-de-sacs using Metzenbaum scissors.  Using curved Heaney clamps, the uterosacral-cardinal  ligament complexes were clamped on either side.  Each pedicle was clamped,  cut and suture ligated using 0 Vicryl suture.  We then walked our way up the broad ligament, staying very snug to the uterus and this was performed and then we removed the specimen and the pedicles were secured using a suture ligature of 0 Vicryl  suture.  We then closed the cuff anterior to posterior with running locked stitch using 0 Vicryl suture after a McCall culdoplasty was performed in standard fashion.  We then turned our attention to the anterior vault and she had a grade II cystocele.   We then placed Allis clamps on either side of the vaginal apex, made a midline incision using Metzenbaum scissors, reduced the cystocele, closed it with a pursestring suture using 2-0 Vicryl, trimmed off the redundant vaginal epithelium and then closed  the vaginal epithelium in a running locked stitch using 2-0 Vicryl.  At the end of the procedure, her cystocele was reduced.  She had good vaginal vault support and she had no rectocele noted.  We then went back up to the abdomen, irrigated the pelvis  and everything was hemostatic.  The trocars and pneumoperitoneum were removed.  The incisions were closed with suture and Dermabond.  There was no vaginal bleeding noted.  Her urine was clear. The patient tolerated the procedure and was extubated and  went to the recovery room in stable condition.  All sponge, lap and instrument counts were correct x2.  VN/NUANCE  D:11/22/2019 T:11/22/2019 JOB:[id number]

## 2020-02-15 DIAGNOSIS — D485 Neoplasm of uncertain behavior of skin: Secondary | ICD-10-CM | POA: Diagnosis not present

## 2020-02-15 DIAGNOSIS — L570 Actinic keratosis: Secondary | ICD-10-CM | POA: Diagnosis not present

## 2020-02-15 DIAGNOSIS — D225 Melanocytic nevi of trunk: Secondary | ICD-10-CM | POA: Diagnosis not present

## 2020-02-15 DIAGNOSIS — D1801 Hemangioma of skin and subcutaneous tissue: Secondary | ICD-10-CM | POA: Diagnosis not present

## 2020-02-15 DIAGNOSIS — L821 Other seborrheic keratosis: Secondary | ICD-10-CM | POA: Diagnosis not present

## 2020-02-29 DIAGNOSIS — N810 Urethrocele: Secondary | ICD-10-CM | POA: Diagnosis not present

## 2020-03-07 DIAGNOSIS — M62838 Other muscle spasm: Secondary | ICD-10-CM | POA: Diagnosis not present

## 2020-03-07 DIAGNOSIS — M6281 Muscle weakness (generalized): Secondary | ICD-10-CM | POA: Diagnosis not present

## 2020-03-07 DIAGNOSIS — N819 Female genital prolapse, unspecified: Secondary | ICD-10-CM | POA: Diagnosis not present

## 2020-03-07 DIAGNOSIS — N393 Stress incontinence (female) (male): Secondary | ICD-10-CM | POA: Diagnosis not present

## 2020-03-13 DIAGNOSIS — N393 Stress incontinence (female) (male): Secondary | ICD-10-CM | POA: Diagnosis not present

## 2020-03-13 DIAGNOSIS — M62838 Other muscle spasm: Secondary | ICD-10-CM | POA: Diagnosis not present

## 2020-03-13 DIAGNOSIS — N819 Female genital prolapse, unspecified: Secondary | ICD-10-CM | POA: Diagnosis not present

## 2020-03-13 DIAGNOSIS — M6281 Muscle weakness (generalized): Secondary | ICD-10-CM | POA: Diagnosis not present

## 2020-03-21 DIAGNOSIS — R102 Pelvic and perineal pain: Secondary | ICD-10-CM | POA: Diagnosis not present

## 2020-03-21 DIAGNOSIS — N819 Female genital prolapse, unspecified: Secondary | ICD-10-CM | POA: Diagnosis not present

## 2020-03-21 DIAGNOSIS — M6281 Muscle weakness (generalized): Secondary | ICD-10-CM | POA: Diagnosis not present

## 2020-03-21 DIAGNOSIS — N393 Stress incontinence (female) (male): Secondary | ICD-10-CM | POA: Diagnosis not present

## 2020-03-28 DIAGNOSIS — N393 Stress incontinence (female) (male): Secondary | ICD-10-CM | POA: Diagnosis not present

## 2020-03-28 DIAGNOSIS — R102 Pelvic and perineal pain: Secondary | ICD-10-CM | POA: Diagnosis not present

## 2020-03-28 DIAGNOSIS — N810 Urethrocele: Secondary | ICD-10-CM | POA: Diagnosis not present

## 2020-03-28 DIAGNOSIS — N819 Female genital prolapse, unspecified: Secondary | ICD-10-CM | POA: Diagnosis not present

## 2020-04-04 DIAGNOSIS — R102 Pelvic and perineal pain: Secondary | ICD-10-CM | POA: Diagnosis not present

## 2020-04-04 DIAGNOSIS — M6281 Muscle weakness (generalized): Secondary | ICD-10-CM | POA: Diagnosis not present

## 2020-04-04 DIAGNOSIS — N819 Female genital prolapse, unspecified: Secondary | ICD-10-CM | POA: Diagnosis not present

## 2020-04-04 DIAGNOSIS — N393 Stress incontinence (female) (male): Secondary | ICD-10-CM | POA: Diagnosis not present

## 2020-04-11 DIAGNOSIS — R102 Pelvic and perineal pain: Secondary | ICD-10-CM | POA: Diagnosis not present

## 2020-04-11 DIAGNOSIS — N819 Female genital prolapse, unspecified: Secondary | ICD-10-CM | POA: Diagnosis not present

## 2020-04-11 DIAGNOSIS — N393 Stress incontinence (female) (male): Secondary | ICD-10-CM | POA: Diagnosis not present

## 2020-04-11 DIAGNOSIS — M6281 Muscle weakness (generalized): Secondary | ICD-10-CM | POA: Diagnosis not present

## 2020-04-18 DIAGNOSIS — N810 Urethrocele: Secondary | ICD-10-CM | POA: Diagnosis not present

## 2020-04-18 DIAGNOSIS — N393 Stress incontinence (female) (male): Secondary | ICD-10-CM | POA: Diagnosis not present

## 2020-04-18 DIAGNOSIS — N819 Female genital prolapse, unspecified: Secondary | ICD-10-CM | POA: Diagnosis not present

## 2020-04-18 DIAGNOSIS — R102 Pelvic and perineal pain: Secondary | ICD-10-CM | POA: Diagnosis not present

## 2020-04-18 MED FILL — OMEPRAZOLE DR 20 MG CAPSULE: 20 | 90 days supply | Qty: 90 | Fill #0

## 2020-04-27 MED FILL — POTASSIUM CHLORIDE CRYS ER: 20 | 60 days supply | Qty: 60 | Fill #0

## 2020-04-28 DIAGNOSIS — Z0000 Encounter for general adult medical examination without abnormal findings: Secondary | ICD-10-CM | POA: Diagnosis not present

## 2020-05-01 DIAGNOSIS — R002 Palpitations: Secondary | ICD-10-CM | POA: Diagnosis not present

## 2020-05-01 DIAGNOSIS — R82998 Other abnormal findings in urine: Secondary | ICD-10-CM | POA: Diagnosis not present

## 2020-05-01 DIAGNOSIS — Z008 Encounter for other general examination: Secondary | ICD-10-CM | POA: Diagnosis not present

## 2020-05-01 DIAGNOSIS — K219 Gastro-esophageal reflux disease without esophagitis: Secondary | ICD-10-CM | POA: Diagnosis not present

## 2020-05-02 DIAGNOSIS — R102 Pelvic and perineal pain: Secondary | ICD-10-CM | POA: Diagnosis not present

## 2020-05-02 DIAGNOSIS — Z1212 Encounter for screening for malignant neoplasm of rectum: Secondary | ICD-10-CM | POA: Diagnosis not present

## 2020-05-02 DIAGNOSIS — N393 Stress incontinence (female) (male): Secondary | ICD-10-CM | POA: Diagnosis not present

## 2020-05-02 DIAGNOSIS — N819 Female genital prolapse, unspecified: Secondary | ICD-10-CM | POA: Diagnosis not present

## 2020-05-02 DIAGNOSIS — M6281 Muscle weakness (generalized): Secondary | ICD-10-CM | POA: Diagnosis not present

## 2020-05-04 DIAGNOSIS — Z1231 Encounter for screening mammogram for malignant neoplasm of breast: Secondary | ICD-10-CM | POA: Diagnosis not present

## 2020-05-08 ENCOUNTER — Telehealth: Payer: Self-pay | Admitting: Internal Medicine

## 2020-05-08 NOTE — Telephone Encounter (Signed)
Ok to schedule colon in the [place]. Thanks

## 2020-05-08 NOTE — Telephone Encounter (Signed)
Pt is not due for a colonoscopt until 2022 but she does not wish to wait that long, pt would like a procedure this year if possible.

## 2020-05-08 NOTE — Telephone Encounter (Signed)
See note below and advise. 

## 2020-05-09 ENCOUNTER — Encounter: Payer: Self-pay | Admitting: Internal Medicine

## 2020-05-09 NOTE — Telephone Encounter (Signed)
Ok to schedule colon, see note below from Dr. Henrene Pastor.

## 2020-05-16 DIAGNOSIS — R922 Inconclusive mammogram: Secondary | ICD-10-CM | POA: Diagnosis not present

## 2020-06-27 ENCOUNTER — Other Ambulatory Visit: Payer: Self-pay

## 2020-06-27 ENCOUNTER — Ambulatory Visit (AMBULATORY_SURGERY_CENTER): Payer: Self-pay | Admitting: *Deleted

## 2020-06-27 ENCOUNTER — Other Ambulatory Visit: Payer: Self-pay | Admitting: Internal Medicine

## 2020-06-27 VITALS — Ht 62.5 in | Wt 128.0 lb

## 2020-06-27 DIAGNOSIS — Z86010 Personal history of colonic polyps: Secondary | ICD-10-CM

## 2020-06-27 DIAGNOSIS — Z85048 Personal history of other malignant neoplasm of rectum, rectosigmoid junction, and anus: Secondary | ICD-10-CM

## 2020-06-27 MED ORDER — SUPREP BOWEL PREP KIT 17.5-3.13-1.6 GM/177ML PO SOLN: 1.0000 | Freq: Once | ORAL | 0 refills | Status: DC

## 2020-06-27 MED FILL — SUPREP BOWEL PREP KIT: 17.5-3.13-1 | 1 days supply | Qty: 354 | Fill #0

## 2020-06-27 NOTE — Progress Notes (Signed)

## 2020-06-29 ENCOUNTER — Encounter: Payer: Self-pay | Admitting: Internal Medicine

## 2020-07-05 ENCOUNTER — Other Ambulatory Visit (HOSPITAL_COMMUNITY): Payer: Self-pay

## 2020-07-05 DIAGNOSIS — N9419 Other specified dyspareunia: Secondary | ICD-10-CM | POA: Diagnosis not present

## 2020-07-05 DIAGNOSIS — N819 Female genital prolapse, unspecified: Secondary | ICD-10-CM | POA: Diagnosis not present

## 2020-07-05 DIAGNOSIS — N958 Other specified menopausal and perimenopausal disorders: Secondary | ICD-10-CM | POA: Diagnosis not present

## 2020-07-05 DIAGNOSIS — N362 Urethral caruncle: Secondary | ICD-10-CM | POA: Diagnosis not present

## 2020-07-05 DIAGNOSIS — N8111 Cystocele, midline: Secondary | ICD-10-CM | POA: Diagnosis not present

## 2020-07-05 DIAGNOSIS — N8112 Cystocele, lateral: Secondary | ICD-10-CM | POA: Diagnosis not present

## 2020-07-05 MED FILL — PREMARIN VAGINAL CREAM-APPL: 0.625 | 90 days supply | Qty: 30 | Fill #0

## 2020-07-13 ENCOUNTER — Encounter: Payer: 59 | Admitting: Internal Medicine

## 2020-07-27 ENCOUNTER — Other Ambulatory Visit: Payer: Self-pay

## 2020-07-27 ENCOUNTER — Ambulatory Visit (AMBULATORY_SURGERY_CENTER): Payer: 59 | Admitting: Internal Medicine

## 2020-07-27 ENCOUNTER — Encounter: Payer: Self-pay | Admitting: Internal Medicine

## 2020-07-27 VITALS — BP 119/74 | HR 83 | Temp 96.6°F | Resp 13 | Ht 62.5 in | Wt 128.0 lb

## 2020-07-27 DIAGNOSIS — Z85048 Personal history of other malignant neoplasm of rectum, rectosigmoid junction, and anus: Secondary | ICD-10-CM

## 2020-07-27 DIAGNOSIS — Z1211 Encounter for screening for malignant neoplasm of colon: Secondary | ICD-10-CM | POA: Diagnosis not present

## 2020-07-27 MED ORDER — SODIUM CHLORIDE 0.9 % IV SOLN: 500.0000 mL | Freq: Once | INTRAVENOUS | Status: DC

## 2020-07-27 NOTE — Patient Instructions (Signed)
Discharge instructions given. Handout on Diverticulosis. Resume previous medications. YOU HAD AN ENDOSCOPIC PROCEDURE TODAY AT THE [facility]:   Refer to the procedure report that was given to you for any specific questions about what was found during the examination.  If the procedure report does not answer your questions, please call your gastroenterologist to clarify.  If you requested that your care partner not be given the details of your procedure findings, then the procedure report has been included in a sealed envelope for you to review at your convenience later.  YOU SHOULD EXPECT: Some feelings of bloating in the abdomen. Passage of more gas than usual.  Walking can help get rid of the air that was put into your GI tract during the procedure and reduce the bloating. If you had a lower endoscopy (such as a colonoscopy or flexible sigmoidoscopy) you may notice spotting of blood in your stool or on the toilet paper. If you underwent a bowel prep for your procedure, you may not have a normal bowel movement for a few days.  Please Note:  You might notice some irritation and congestion in your nose or some drainage.  This is from the oxygen used during your procedure.  There is no need for concern and it should clear up in a day or so.  SYMPTOMS TO REPORT IMMEDIATELY:  Following lower endoscopy (colonoscopy or flexible sigmoidoscopy):  Excessive amounts of blood in the stool  Significant tenderness or worsening of abdominal pains  Swelling of the abdomen that is new, acute  Fever of 100F or higher   For urgent or emergent issues, a gastroenterologist can be reached at any hour by calling [phone]. Do not use MyChart messaging for urgent concerns.    DIET:  We do recommend a small meal at first, but then you may proceed to your regular diet.  Drink plenty of fluids but you should avoid alcoholic beverages for 24 hours.  ACTIVITY:  You should plan to take it easy for  the rest of today and you should NOT DRIVE or use heavy machinery until tomorrow (because of the sedation medicines used during the test).    FOLLOW UP: Our staff will call the number listed on your records 48-72 hours following your procedure to check on you and address any questions or concerns that you may have regarding the information given to you following your procedure. If we do not reach you, we will leave a message.  We will attempt to reach you two times.  During this call, we will ask if you have developed any symptoms of COVID 19. If you develop any symptoms (ie: fever, flu-like symptoms, shortness of breath, cough etc.) before then, please call [phone].  If you test positive for Covid 19 in the 2 weeks post procedure, please call and report this information to us.    If any biopsies were taken you will be contacted by phone or by letter within the next 1-3 weeks.  Please call us at [phone] if you have not heard about the biopsies in 3 weeks.    SIGNATURES/CONFIDENTIALITY: You and/or your care partner have signed paperwork which will be entered into your electronic medical record.  These signatures attest to the fact that that the information above on your After Visit Summary has been reviewed and is understood.  Full responsibility of the confidentiality of this discharge information lies with you and/or your care-partner.  

## 2020-07-27 NOTE — Progress Notes (Signed)
VS by SP   Pt's states no medical or surgical changes since previsit or office visit.

## 2020-07-27 NOTE — Op Note (Signed)
[place] Patient Name: Cheryl Holland Procedure Date: 07/27/2020 2:11 PM MRN: [id number] Endoscopist: Docia Chuck. Henrene Pastor MD, MD Age: 59 Referring MD:  Date of Birth: [birth year] Gender: Female Account #: [id number] Procedure:                Colonoscopy Indications:              High risk colon cancer surveillance: Personal                            history of rectal cancer 2013. Multiple subsequent                            examinations. Last examination 2019. Now for                            follow-up Medicines:                Monitored Anesthesia Care Procedure:                Pre-Anesthesia Assessment:                           - Prior to the procedure, a History and Physical                            was performed, and patient medications and                            allergies were reviewed. The patient's tolerance of                            previous anesthesia was also reviewed. The risks                            and benefits of the procedure and the sedation                            options and risks were discussed with the patient.                            All questions were answered, and informed consent                            was obtained. Prior Anticoagulants: The patient has                            taken no previous anticoagulant or antiplatelet                            agents. ASA Grade Assessment: I - A normal, healthy                            patient. After reviewing the risks and benefits,  the patient was deemed in satisfactory condition to                            undergo the procedure.                           After obtaining informed consent, the colonoscope                            was passed under direct vision. Throughout the                            procedure, the patient's blood pressure, pulse, and                            oxygen saturations were monitored continuously. The                             Colonoscope was introduced through the anus and                            advanced to the the cecum, identified by                            appendiceal orifice and ileocecal valve. The                            ileocecal valve, appendiceal orifice, and rectum                            were photographed. The quality of the bowel                            preparation was excellent. The colonoscopy was                            performed without difficulty. The patient tolerated                            the procedure well. The bowel preparation used was                            SUPREP via split dose instruction. Scope In: 2:21:46 PM Scope Out: 2:34:13 PM Scope Withdrawal Time: 0 hours 7 minutes 50 seconds  Total Procedure Duration: 0 hours 12 minutes 27 seconds  Findings:                 A single medium-mouthed diverticulum was found in                            the sigmoid colon and ascending colon.                           Scarring in the rectum from previous surgical  resection identified with normal surrounding                            mucosa. The exam was otherwise without abnormality                            on direct and retroflexion views. Complications:            No immediate complications. Estimated blood loss:                            None. Estimated Blood Loss:     Estimated blood loss: none. Impression:               - Diverticulosis in the sigmoid colon and in the                            ascending colon.                           - The examination was otherwise normal on direct                            and retroflexion views.                           - No specimens collected. Recommendation:           - Repeat colonoscopy in 3 years for surveillance.                           - Patient has a contact number available for                            emergencies. The signs and symptoms of potential                             delayed complications were discussed with the                            patient. Return to normal activities tomorrow.                            Written discharge instructions were provided to the                            patient.                           - Resume previous diet.                           - Continue present medications. Docia Chuck. Henrene Pastor MD, MD 07/27/2020 2:48:39 PM This report has been signed electronically.

## 2020-07-27 NOTE — Progress Notes (Signed)
A and O x3. Report to RN. Tolerated MAC anesthesia well.

## 2020-07-31 ENCOUNTER — Telehealth: Payer: Self-pay

## 2020-07-31 ENCOUNTER — Telehealth: Payer: Self-pay | Admitting: *Deleted

## 2020-07-31 NOTE — Telephone Encounter (Signed)
°  Follow up Call-  Call back number 07/27/2020 08/27/2018  Post procedure Call Back phone  # [phone]  Permission to leave phone message Yes Yes  Some recent data might be hidden     Patient questions:  Do you have a fever, pain , or abdominal swelling? No. Pain Score  0 *  Have you tolerated food without any problems? Yes.    Have you been able to return to your normal activities? Yes.    Do you have any questions about your discharge instructions: Diet   No. Medications  No. Follow up visit  No.  Do you have questions or concerns about your Care? No.  Actions: * If pain score is 4 or above: No action needed, pain <4.  1. Have you developed a fever since your procedure? no  2.   Have you had an respiratory symptoms (SOB or cough) since your procedure? no  3.   Have you tested positive for COVID 19 since your procedure no  4.   Have you had any family members/close contacts diagnosed with the COVID 19 since your procedure?  no   If yes to any of these questions please route to Joylene John, RN and Joella Prince, RN

## 2020-07-31 NOTE — Telephone Encounter (Signed)
First attempt, left VM.  

## 2020-09-22 MED FILL — PREMARIN VAGINAL CREAM-APPL: 0.625 | 90 days supply | Qty: 30 | Fill #1

## 2020-10-11 DIAGNOSIS — N952 Postmenopausal atrophic vaginitis: Secondary | ICD-10-CM | POA: Diagnosis not present

## 2020-10-11 DIAGNOSIS — N8111 Cystocele, midline: Secondary | ICD-10-CM | POA: Diagnosis not present

## 2021-01-02 ENCOUNTER — Other Ambulatory Visit (HOSPITAL_COMMUNITY): Payer: Self-pay

## 2021-01-02 MED FILL — Estrogens, Conjugated Vaginal Cream 0.625 MG/GM: VAGINAL | 90 days supply | Qty: 30 | Fill #0 | Status: AC

## 2021-01-03 ENCOUNTER — Other Ambulatory Visit (HOSPITAL_COMMUNITY): Payer: Self-pay

## 2021-01-04 ENCOUNTER — Other Ambulatory Visit (HOSPITAL_COMMUNITY): Payer: Self-pay

## 2021-01-08 ENCOUNTER — Other Ambulatory Visit (HOSPITAL_COMMUNITY): Payer: Self-pay

## 2021-02-13 ENCOUNTER — Other Ambulatory Visit (HOSPITAL_COMMUNITY): Payer: Self-pay

## 2021-02-13 DIAGNOSIS — H1033 Unspecified acute conjunctivitis, bilateral: Secondary | ICD-10-CM | POA: Diagnosis not present

## 2021-02-13 DIAGNOSIS — R059 Cough, unspecified: Secondary | ICD-10-CM | POA: Diagnosis not present

## 2021-02-13 DIAGNOSIS — J0100 Acute maxillary sinusitis, unspecified: Secondary | ICD-10-CM | POA: Diagnosis not present

## 2021-02-13 MED ORDER — HYDROCODONE BIT-HOMATROP MBR 5-1.5 MG/5ML PO SOLN
5.0000 mL | Freq: Four times a day (QID) | ORAL | 0 refills | Status: DC | PRN
  Filled 2021-02-13: qty 140, 7d supply, fill #0

## 2021-02-13 MED ORDER — POLYMYXIN B-TRIMETHOPRIM 10000-0.1 UNIT/ML-% OP SOLN
1.0000 [drp] | Freq: Four times a day (QID) | OPHTHALMIC | 0 refills | Status: AC
  Filled 2021-02-13: qty 10, 13d supply, fill #0

## 2021-02-13 MED ORDER — AZITHROMYCIN 250 MG PO TABS
ORAL_TABLET | ORAL | 0 refills | Status: DC
  Filled 2021-02-13: qty 6, 5d supply, fill #0

## 2021-02-13 MED ORDER — PREDNISONE 10 MG PO TABS
ORAL_TABLET | ORAL | 0 refills | Status: AC
  Filled 2021-02-13: qty 7, 5d supply, fill #0

## 2021-03-06 ENCOUNTER — Other Ambulatory Visit (HOSPITAL_COMMUNITY): Payer: Self-pay

## 2021-03-06 MED ORDER — OMEPRAZOLE 20 MG PO CPDR
20.0000 mg | DELAYED_RELEASE_CAPSULE | Freq: Every day | ORAL | 1 refills | Status: DC
  Filled 2021-03-06: qty 90, 90d supply, fill #0

## 2021-03-12 ENCOUNTER — Other Ambulatory Visit (HOSPITAL_COMMUNITY): Payer: Self-pay

## 2021-03-12 MED FILL — Estrogens, Conjugated Vaginal Cream 0.625 MG/GM: VAGINAL | 90 days supply | Qty: 30 | Fill #1 | Status: CN

## 2021-03-13 ENCOUNTER — Other Ambulatory Visit (HOSPITAL_COMMUNITY): Payer: Self-pay

## 2021-03-16 ENCOUNTER — Other Ambulatory Visit (HOSPITAL_COMMUNITY): Payer: Self-pay

## 2021-03-16 MED FILL — Estrogens, Conjugated Vaginal Cream 0.625 MG/GM: VAGINAL | 90 days supply | Qty: 30 | Fill #1 | Status: CN

## 2021-03-29 DIAGNOSIS — Z23 Encounter for immunization: Secondary | ICD-10-CM | POA: Diagnosis not present

## 2021-04-16 DIAGNOSIS — Z13820 Encounter for screening for osteoporosis: Secondary | ICD-10-CM | POA: Diagnosis not present

## 2021-04-16 DIAGNOSIS — Z01419 Encounter for gynecological examination (general) (routine) without abnormal findings: Secondary | ICD-10-CM | POA: Diagnosis not present

## 2021-04-16 DIAGNOSIS — Z6824 Body mass index (BMI) 24.0-24.9, adult: Secondary | ICD-10-CM | POA: Diagnosis not present

## 2021-06-12 DIAGNOSIS — L814 Other melanin hyperpigmentation: Secondary | ICD-10-CM | POA: Diagnosis not present

## 2021-06-12 DIAGNOSIS — D1801 Hemangioma of skin and subcutaneous tissue: Secondary | ICD-10-CM | POA: Diagnosis not present

## 2021-06-12 DIAGNOSIS — L821 Other seborrheic keratosis: Secondary | ICD-10-CM | POA: Diagnosis not present

## 2021-06-12 DIAGNOSIS — L570 Actinic keratosis: Secondary | ICD-10-CM | POA: Diagnosis not present

## 2021-06-12 DIAGNOSIS — D225 Melanocytic nevi of trunk: Secondary | ICD-10-CM | POA: Diagnosis not present

## 2021-06-19 DIAGNOSIS — Z1231 Encounter for screening mammogram for malignant neoplasm of breast: Secondary | ICD-10-CM | POA: Diagnosis not present

## 2021-06-21 DIAGNOSIS — E785 Hyperlipidemia, unspecified: Secondary | ICD-10-CM | POA: Diagnosis not present

## 2021-06-25 ENCOUNTER — Other Ambulatory Visit: Payer: Self-pay | Admitting: Internal Medicine

## 2021-06-25 DIAGNOSIS — Z1389 Encounter for screening for other disorder: Secondary | ICD-10-CM | POA: Diagnosis not present

## 2021-06-25 DIAGNOSIS — Z1212 Encounter for screening for malignant neoplasm of rectum: Secondary | ICD-10-CM | POA: Diagnosis not present

## 2021-06-25 DIAGNOSIS — Z0000 Encounter for general adult medical examination without abnormal findings: Secondary | ICD-10-CM | POA: Diagnosis not present

## 2021-06-25 DIAGNOSIS — R82998 Other abnormal findings in urine: Secondary | ICD-10-CM | POA: Diagnosis not present

## 2021-06-25 DIAGNOSIS — E785 Hyperlipidemia, unspecified: Secondary | ICD-10-CM

## 2021-06-25 DIAGNOSIS — Z1331 Encounter for screening for depression: Secondary | ICD-10-CM | POA: Diagnosis not present

## 2021-07-16 ENCOUNTER — Ambulatory Visit
Admission: RE | Admit: 2021-07-16 | Discharge: 2021-07-16 | Disposition: A | Discharge status: Home / Self Care | Payer: 59 | Source: Ambulatory Visit | Attending: Internal Medicine | Admitting: Internal Medicine

## 2021-07-16 DIAGNOSIS — E785 Hyperlipidemia, unspecified: Secondary | ICD-10-CM

## 2021-07-16 DIAGNOSIS — Z136 Encounter for screening for cardiovascular disorders: Secondary | ICD-10-CM | POA: Diagnosis not present

## 2021-07-23 ENCOUNTER — Other Ambulatory Visit (HOSPITAL_COMMUNITY): Payer: Self-pay

## 2021-07-24 ENCOUNTER — Other Ambulatory Visit (HOSPITAL_COMMUNITY): Payer: Self-pay

## 2021-07-26 ENCOUNTER — Other Ambulatory Visit (HOSPITAL_COMMUNITY): Payer: Self-pay

## 2021-07-30 ENCOUNTER — Other Ambulatory Visit (HOSPITAL_COMMUNITY): Payer: Self-pay

## 2021-07-31 ENCOUNTER — Other Ambulatory Visit (HOSPITAL_COMMUNITY): Payer: Self-pay

## 2021-08-01 ENCOUNTER — Other Ambulatory Visit (HOSPITAL_COMMUNITY): Payer: Self-pay

## 2021-08-01 MED ORDER — OMEPRAZOLE 20 MG PO CPDR
20.0000 mg | DELAYED_RELEASE_CAPSULE | Freq: Every day | ORAL | 3 refills | Status: DC
  Filled 2021-08-01: qty 90, 90d supply, fill #0
  Filled 2021-11-12: qty 90, 90d supply, fill #1
  Filled 2022-02-12: qty 90, 90d supply, fill #2
  Filled 2022-06-25: qty 90, 90d supply, fill #3

## 2021-08-02 ENCOUNTER — Other Ambulatory Visit (HOSPITAL_COMMUNITY): Payer: Self-pay

## 2021-08-08 ENCOUNTER — Other Ambulatory Visit (HOSPITAL_COMMUNITY): Payer: Self-pay

## 2021-08-21 ENCOUNTER — Other Ambulatory Visit (HOSPITAL_COMMUNITY): Payer: Self-pay

## 2021-08-21 DIAGNOSIS — J101 Influenza due to other identified influenza virus with other respiratory manifestations: Secondary | ICD-10-CM | POA: Diagnosis not present

## 2021-08-21 DIAGNOSIS — R5383 Other fatigue: Secondary | ICD-10-CM | POA: Diagnosis not present

## 2021-08-21 DIAGNOSIS — R051 Acute cough: Secondary | ICD-10-CM | POA: Diagnosis not present

## 2021-08-21 DIAGNOSIS — R0789 Other chest pain: Secondary | ICD-10-CM | POA: Diagnosis not present

## 2021-08-21 DIAGNOSIS — Z1152 Encounter for screening for COVID-19: Secondary | ICD-10-CM | POA: Diagnosis not present

## 2021-08-21 DIAGNOSIS — R0981 Nasal congestion: Secondary | ICD-10-CM | POA: Diagnosis not present

## 2021-08-21 MED ORDER — AZITHROMYCIN 250 MG PO TABS
ORAL_TABLET | ORAL | 0 refills | Status: DC
  Filled 2021-08-21: qty 6, 5d supply, fill #0

## 2021-08-21 MED ORDER — HYDROCODONE BIT-HOMATROP MBR 5-1.5 MG/5ML PO SOLN
5.0000 mL | Freq: Four times a day (QID) | ORAL | 0 refills | Status: DC | PRN
  Filled 2021-08-21: qty 140, 7d supply, fill #0

## 2021-08-21 MED ORDER — PREDNISONE 10 MG PO TABS
10.0000 mg | ORAL_TABLET | Freq: Two times a day (BID) | ORAL | 0 refills | Status: DC
  Filled 2021-08-21: qty 10, 5d supply, fill #0

## 2021-08-21 MED ORDER — OSELTAMIVIR PHOSPHATE 75 MG PO CAPS
75.0000 mg | ORAL_CAPSULE | Freq: Two times a day (BID) | ORAL | 0 refills | Status: DC
  Filled 2021-08-21: qty 10, 5d supply, fill #0

## 2021-08-27 ENCOUNTER — Other Ambulatory Visit (HOSPITAL_COMMUNITY): Payer: Self-pay

## 2021-08-27 MED ORDER — ESTROGENS CONJUGATED 0.625 MG/GM VA CREA
TOPICAL_CREAM | VAGINAL | 5 refills | Status: AC
  Filled 2021-08-27 – 2021-10-09 (×2): qty 30, 90d supply, fill #0

## 2021-08-28 ENCOUNTER — Other Ambulatory Visit (HOSPITAL_COMMUNITY): Payer: Self-pay

## 2021-08-30 ENCOUNTER — Other Ambulatory Visit (HOSPITAL_COMMUNITY): Payer: Self-pay

## 2021-09-05 ENCOUNTER — Other Ambulatory Visit (HOSPITAL_COMMUNITY): Payer: Self-pay

## 2021-09-05 DIAGNOSIS — H5213 Myopia, bilateral: Secondary | ICD-10-CM | POA: Diagnosis not present

## 2021-09-05 DIAGNOSIS — H10413 Chronic giant papillary conjunctivitis, bilateral: Secondary | ICD-10-CM | POA: Diagnosis not present

## 2021-09-05 DIAGNOSIS — H2513 Age-related nuclear cataract, bilateral: Secondary | ICD-10-CM | POA: Diagnosis not present

## 2021-10-09 ENCOUNTER — Other Ambulatory Visit (HOSPITAL_COMMUNITY): Payer: Self-pay

## 2021-11-12 ENCOUNTER — Other Ambulatory Visit (HOSPITAL_COMMUNITY): Payer: Self-pay

## 2021-11-20 ENCOUNTER — Other Ambulatory Visit: Payer: Self-pay | Admitting: Internal Medicine

## 2021-11-20 DIAGNOSIS — R918 Other nonspecific abnormal finding of lung field: Secondary | ICD-10-CM

## 2021-12-17 ENCOUNTER — Ambulatory Visit
Admission: RE | Admit: 2021-12-17 | Discharge: 2021-12-17 | Disposition: A | Discharge status: Home / Self Care | Payer: 59 | Source: Ambulatory Visit | Attending: Internal Medicine | Admitting: Internal Medicine

## 2021-12-17 DIAGNOSIS — R918 Other nonspecific abnormal finding of lung field: Secondary | ICD-10-CM

## 2021-12-17 DIAGNOSIS — R911 Solitary pulmonary nodule: Secondary | ICD-10-CM | POA: Diagnosis not present

## 2022-02-12 ENCOUNTER — Other Ambulatory Visit (HOSPITAL_COMMUNITY): Payer: Self-pay

## 2022-06-20 DIAGNOSIS — Z1231 Encounter for screening mammogram for malignant neoplasm of breast: Secondary | ICD-10-CM | POA: Diagnosis not present

## 2022-06-25 ENCOUNTER — Other Ambulatory Visit (HOSPITAL_COMMUNITY): Payer: Self-pay

## 2022-06-27 DIAGNOSIS — R7989 Other specified abnormal findings of blood chemistry: Secondary | ICD-10-CM | POA: Diagnosis not present

## 2022-06-27 DIAGNOSIS — E785 Hyperlipidemia, unspecified: Secondary | ICD-10-CM | POA: Diagnosis not present

## 2022-07-01 ENCOUNTER — Other Ambulatory Visit (HOSPITAL_COMMUNITY): Payer: Self-pay

## 2022-07-01 DIAGNOSIS — J329 Chronic sinusitis, unspecified: Secondary | ICD-10-CM | POA: Diagnosis not present

## 2022-07-01 DIAGNOSIS — C189 Malignant neoplasm of colon, unspecified: Secondary | ICD-10-CM | POA: Diagnosis not present

## 2022-07-01 DIAGNOSIS — Z008 Encounter for other general examination: Secondary | ICD-10-CM | POA: Diagnosis not present

## 2022-07-01 DIAGNOSIS — R82998 Other abnormal findings in urine: Secondary | ICD-10-CM | POA: Diagnosis not present

## 2022-07-01 DIAGNOSIS — E785 Hyperlipidemia, unspecified: Secondary | ICD-10-CM | POA: Diagnosis not present

## 2022-07-01 DIAGNOSIS — Z1339 Encounter for screening examination for other mental health and behavioral disorders: Secondary | ICD-10-CM | POA: Diagnosis not present

## 2022-07-01 DIAGNOSIS — Z1331 Encounter for screening for depression: Secondary | ICD-10-CM | POA: Diagnosis not present

## 2022-07-01 MED ORDER — CEFDINIR 300 MG PO CAPS
300.0000 mg | ORAL_CAPSULE | Freq: Two times a day (BID) | ORAL | 0 refills | Status: DC
  Filled 2022-07-01: qty 30, 15d supply, fill #0

## 2022-07-01 MED ORDER — ROSUVASTATIN CALCIUM 20 MG PO TABS
20.0000 mg | ORAL_TABLET | Freq: Every day | ORAL | 0 refills | Status: DC
  Filled 2022-07-01: qty 30, 30d supply, fill #0

## 2022-07-16 ENCOUNTER — Other Ambulatory Visit (HOSPITAL_COMMUNITY): Payer: Self-pay

## 2022-07-16 DIAGNOSIS — Z6823 Body mass index (BMI) 23.0-23.9, adult: Secondary | ICD-10-CM | POA: Diagnosis not present

## 2022-07-16 DIAGNOSIS — Z01419 Encounter for gynecological examination (general) (routine) without abnormal findings: Secondary | ICD-10-CM | POA: Diagnosis not present

## 2022-07-16 DIAGNOSIS — N952 Postmenopausal atrophic vaginitis: Secondary | ICD-10-CM | POA: Diagnosis not present

## 2022-07-16 DIAGNOSIS — J069 Acute upper respiratory infection, unspecified: Secondary | ICD-10-CM | POA: Diagnosis not present

## 2022-07-16 DIAGNOSIS — Z1272 Encounter for screening for malignant neoplasm of vagina: Secondary | ICD-10-CM | POA: Diagnosis not present

## 2022-07-16 MED ORDER — AMOXICILLIN-POT CLAVULANATE 875-125 MG PO TABS
1.0000 | ORAL_TABLET | Freq: Two times a day (BID) | ORAL | 0 refills | Status: DC
  Filled 2022-07-16: qty 14, 7d supply, fill #0

## 2022-07-16 MED ORDER — PREMARIN 0.625 MG/GM VA CREA
0.5000 | TOPICAL_CREAM | VAGINAL | 3 refills | Status: AC
  Filled 2022-07-16: qty 30, 90d supply, fill #0

## 2022-07-18 IMAGING — CT CT CARDIAC CORONARY ARTERY CALCIUM SCORE
3 series · 13 of 20 positions shown, 15 images · non-contrast
Comparison: Chest CT 02/17/2013

CLINICAL DATA: 60-year-old white female with hyperlipidemia,
unspecified hyperlipidemia type.

EXAM:
CT CARDIAC CORONARY ARTERY CALCIUM SCORE
TECHNIQUE: Non-contrast imaging through the heart was performed using
prospective ECG gating. Image post processing was performed on an
independent workstation, allowing for quantitative analysis of the
heart and coronary arteries. Note that this exam targets the heart
and the chest was not imaged in its entirety.

[Series 2: calcium scoring 2.00 qr36 bestdiast 69% hrt calciu · axial · 0.35mm/px · z∈[+1628,+1684]mm · 3 of 70 slices shown]
[im 14/70  vessel]
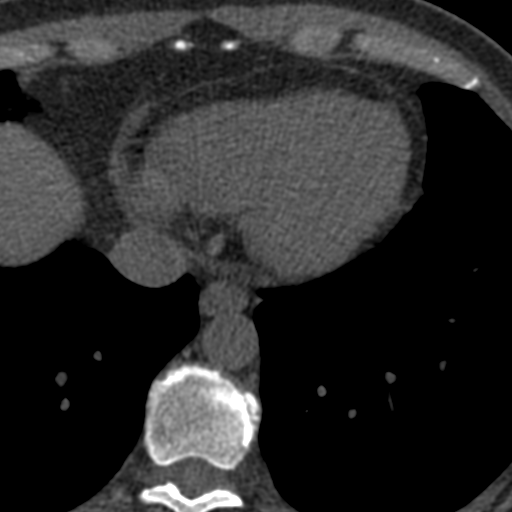
[im 28/70  vessel]
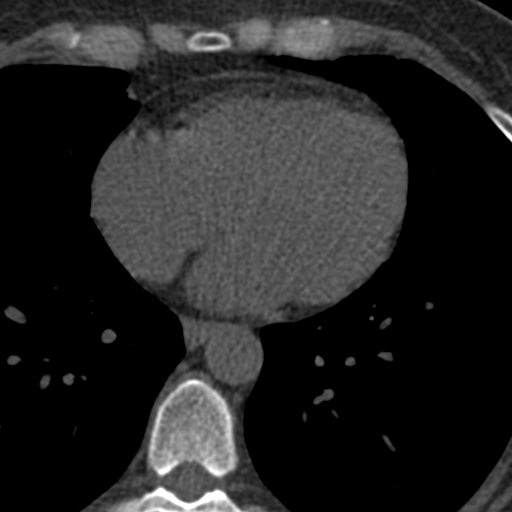
[im 42/70  vessel]
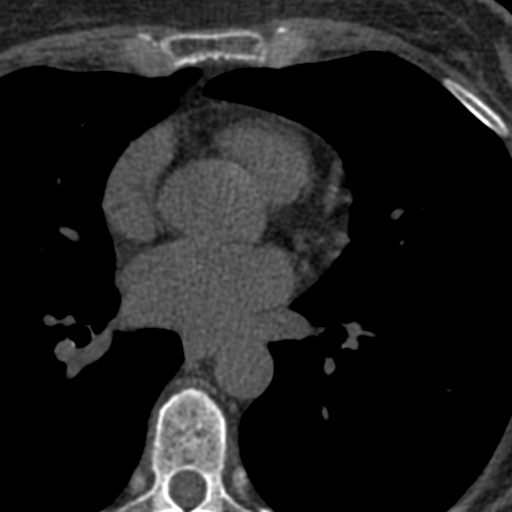

[Series 3: calcium scoring 2.00 br40 bestdiast 69% axial · axial · 0.53mm/px · z∈[+1624,+1716]mm · 5 of 70 slices shown, 7 images]
[im 12/70  vessel]
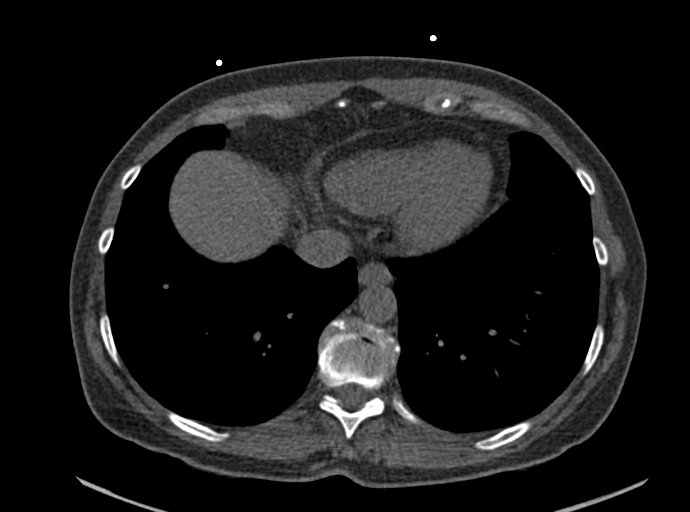
[im 12/70  lung]
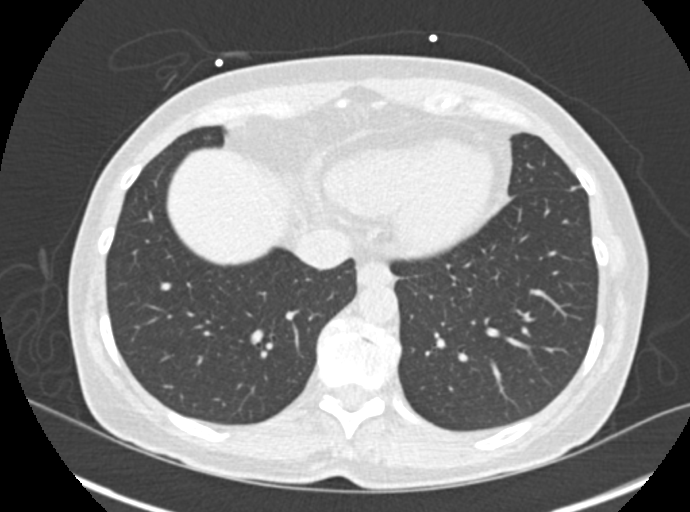
[im 24/70  vessel]
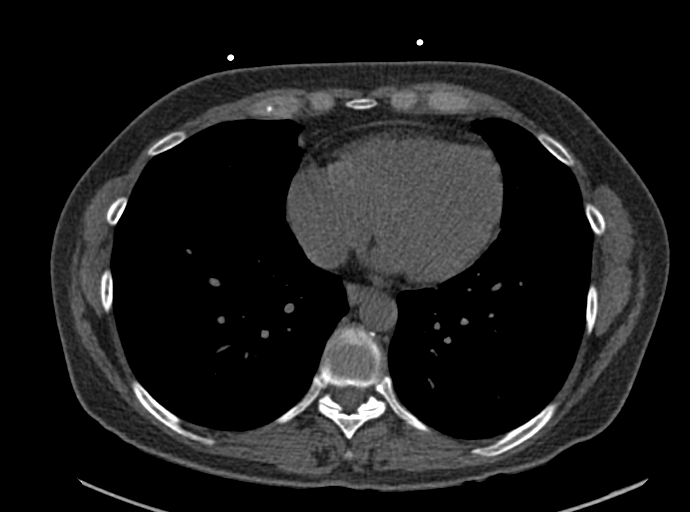
[im 35/70  vessel]
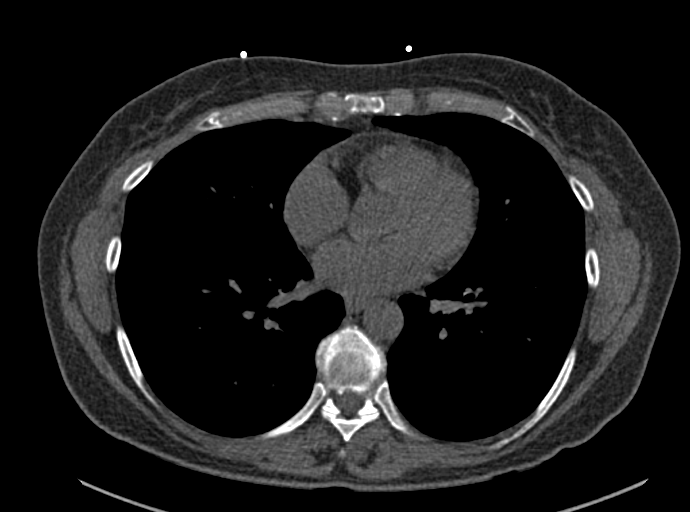
[im 47/70  vessel]
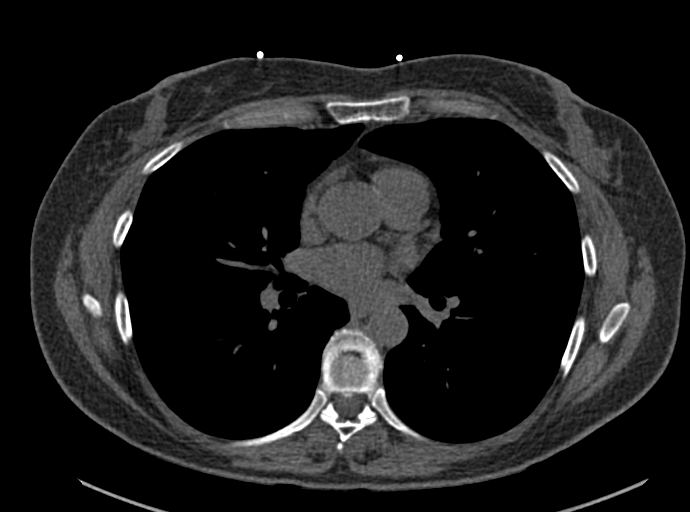
[im 58/70  vessel]
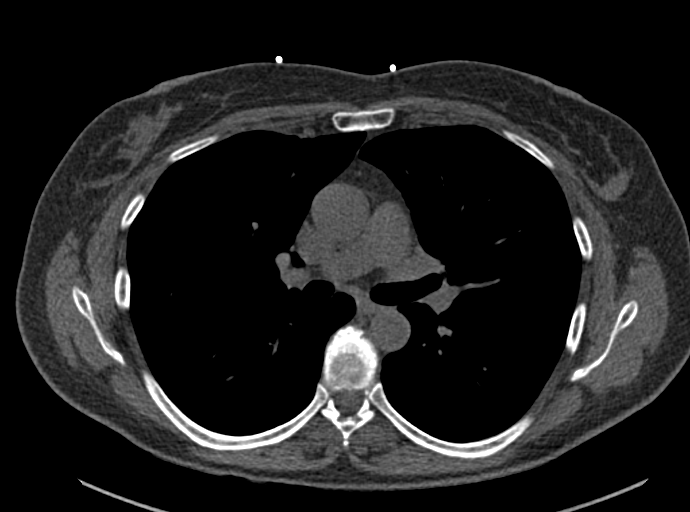
[im 58/70  lung]
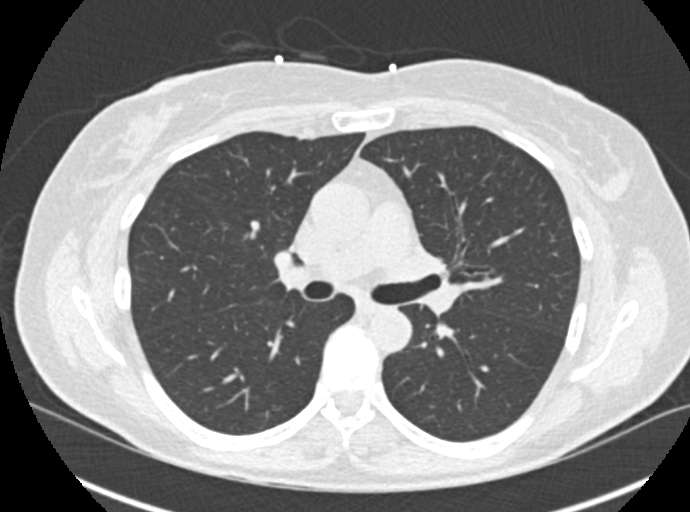

[Series 9: calcium scoring 2.00 br60 bestdiast 69% lungs · axial · 0.53mm/px · z∈[+1624,+1716]mm · 5 of 70 slices shown]
[im 12/70  vessel]
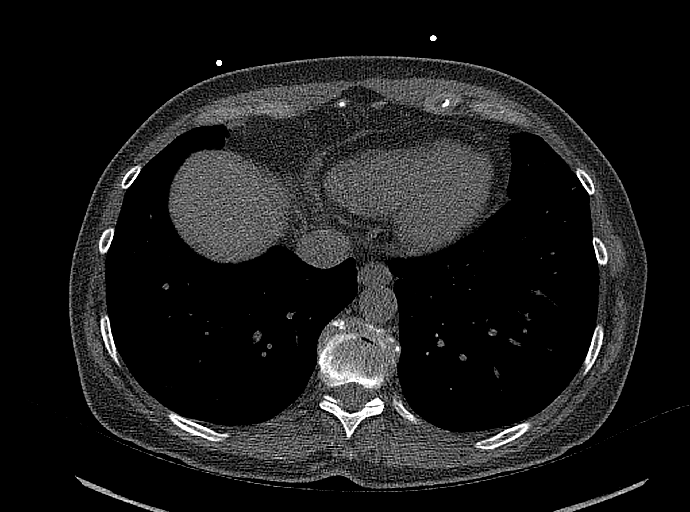
[im 24/70  vessel]
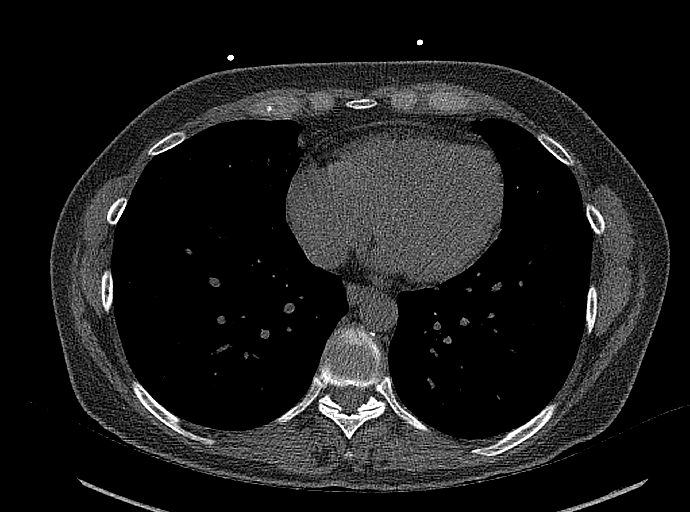
[im 35/70  vessel]
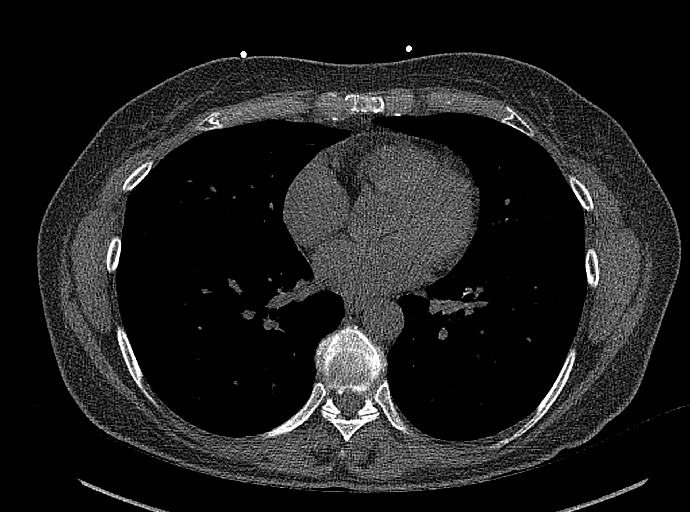
[im 47/70  vessel]
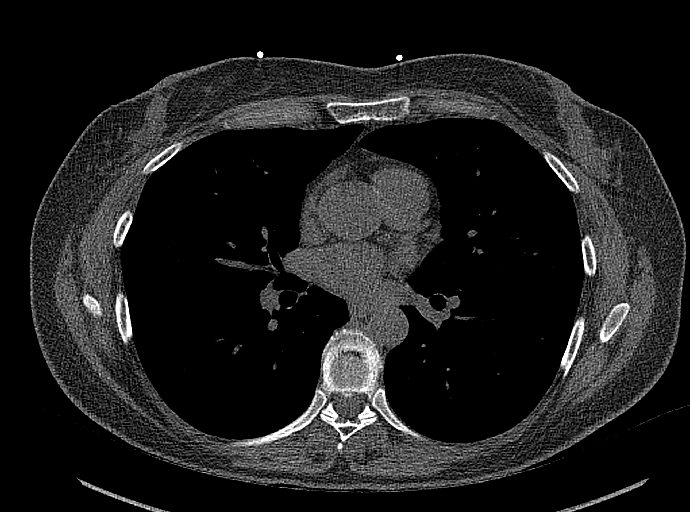
[im 58/70  vessel]
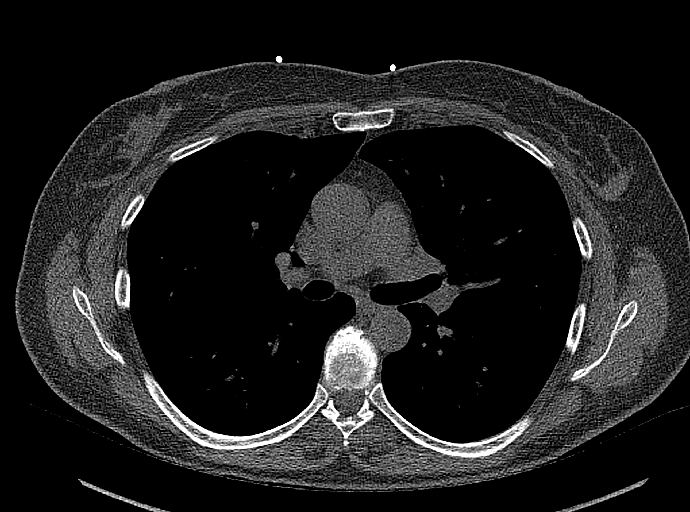

[13 of 20 positions shown; findings below may reference images not displayed]

FINDINGS: CORONARY CALCIUM SCORES:

Left Main: 0

LAD:

LCx: 0

RCA: 0

Total Agatston Score:

[HOSPITAL] percentile: 69

AORTA MEASUREMENTS:

Ascending Aorta: 31 mm

Descending Aorta: 23 mm

OTHER FINDINGS:

Limited evaluation of the left coronary artery origin. Heart size is
normal. No significant pericardial effusion. Evidence for a small
hiatal hernia. No significant lymph node enlargement in the
visualized mediastinum. Images of the upper abdomen are
unremarkable. Stable small nodular densities along the periphery of
the right middle lobe on sequence 9, images 42 and 36. Stable
nodular density in the right middle lobe on image 28. Again noted
are prominent right internal mammary vessels along the anterior
right chest. 6 mm nodule in the right lower lobe is incompletely
imaged on sequence 9 image 70 but this area measured roughly 4 mm in
1261. Stable nodular density along the posterior right lower lobe on
image 45. Multiple subpleural nodules involving the right lower
lobe. Minimal change in peripheral nodule in the lingula on image
37. Stable subpleural nodules in left lower lobe on images 67 and
65. No large pleural effusions. No acute bone abnormality.
IMPRESSION: 1. Coronary calcium score is 9.39 and this is at percentile 69 for
patients of the same age, gender and ethnicity.
2. Again noted are multiple small pulmonary nodules. Largest
pulmonary nodule is located in the periphery of the right lower
lobe, measuring 6 mm but incompletely imaged on this coronary
calcium score examination. However, this nodular area may have
enlarged compared to 1261. Nodules are likely benign based on the
minimal change but recommend a dedicated chest CT to fully
characterize the enlarged right lower lobe pulmonary nodule.
Consider chest CT with IV contrast to better characterize the
prominent right internal mammary vessels.

## 2022-07-25 ENCOUNTER — Other Ambulatory Visit (HOSPITAL_COMMUNITY): Payer: Self-pay

## 2022-07-25 MED ORDER — HYDROCODONE BIT-HOMATROP MBR 5-1.5 MG/5ML PO SOLN
5.0000 mL | Freq: Four times a day (QID) | ORAL | 0 refills | Status: DC | PRN
  Filled 2022-07-25: qty 200, 10d supply, fill #0

## 2022-07-30 ENCOUNTER — Other Ambulatory Visit (HOSPITAL_COMMUNITY): Payer: Self-pay

## 2022-07-30 DIAGNOSIS — J209 Acute bronchitis, unspecified: Secondary | ICD-10-CM | POA: Diagnosis not present

## 2022-07-30 DIAGNOSIS — R058 Other specified cough: Secondary | ICD-10-CM | POA: Diagnosis not present

## 2022-07-30 DIAGNOSIS — K219 Gastro-esophageal reflux disease without esophagitis: Secondary | ICD-10-CM | POA: Diagnosis not present

## 2022-07-30 MED ORDER — AZITHROMYCIN 250 MG PO TABS
ORAL_TABLET | ORAL | 0 refills | Status: AC
  Filled 2022-07-30: qty 6, 5d supply, fill #0

## 2022-07-30 MED ORDER — PREDNISONE 20 MG PO TABS
ORAL_TABLET | ORAL | 0 refills | Status: AC
  Filled 2022-07-30: qty 15, 10d supply, fill #0

## 2022-07-30 MED ORDER — HYDROCODONE BIT-HOMATROP MBR 5-1.5 MG/5ML PO SOLN: 5.0000 mL | Freq: Four times a day (QID) | ORAL | 0 refills | Status: DC | PRN

## 2022-08-21 DIAGNOSIS — N9089 Other specified noninflammatory disorders of vulva and perineum: Secondary | ICD-10-CM | POA: Diagnosis not present

## 2022-08-21 DIAGNOSIS — L814 Other melanin hyperpigmentation: Secondary | ICD-10-CM | POA: Diagnosis not present

## 2022-10-23 ENCOUNTER — Other Ambulatory Visit (HOSPITAL_COMMUNITY): Payer: Self-pay

## 2022-10-23 MED ORDER — OMEPRAZOLE 20 MG PO CPDR
20.0000 mg | DELAYED_RELEASE_CAPSULE | Freq: Every day | ORAL | 3 refills | Status: DC
  Filled 2022-10-23: qty 90, 90d supply, fill #0
  Filled 2023-04-25: qty 90, 90d supply, fill #1
  Filled 2023-10-08: qty 90, 90d supply, fill #2

## 2022-11-20 ENCOUNTER — Other Ambulatory Visit (HOSPITAL_COMMUNITY): Payer: Self-pay

## 2022-11-20 DIAGNOSIS — D225 Melanocytic nevi of trunk: Secondary | ICD-10-CM | POA: Diagnosis not present

## 2022-11-20 DIAGNOSIS — L821 Other seborrheic keratosis: Secondary | ICD-10-CM | POA: Diagnosis not present

## 2022-11-20 DIAGNOSIS — R051 Acute cough: Secondary | ICD-10-CM | POA: Diagnosis not present

## 2022-11-20 DIAGNOSIS — D1801 Hemangioma of skin and subcutaneous tissue: Secondary | ICD-10-CM | POA: Diagnosis not present

## 2022-11-20 DIAGNOSIS — J40 Bronchitis, not specified as acute or chronic: Secondary | ICD-10-CM | POA: Diagnosis not present

## 2022-11-20 DIAGNOSIS — Z1152 Encounter for screening for COVID-19: Secondary | ICD-10-CM | POA: Diagnosis not present

## 2022-11-20 DIAGNOSIS — L814 Other melanin hyperpigmentation: Secondary | ICD-10-CM | POA: Diagnosis not present

## 2022-11-20 DIAGNOSIS — D2271 Melanocytic nevi of right lower limb, including hip: Secondary | ICD-10-CM | POA: Diagnosis not present

## 2022-11-20 DIAGNOSIS — L570 Actinic keratosis: Secondary | ICD-10-CM | POA: Diagnosis not present

## 2022-11-20 DIAGNOSIS — J069 Acute upper respiratory infection, unspecified: Secondary | ICD-10-CM | POA: Diagnosis not present

## 2022-11-20 DIAGNOSIS — D2272 Melanocytic nevi of left lower limb, including hip: Secondary | ICD-10-CM | POA: Diagnosis not present

## 2022-11-20 MED ORDER — AZITHROMYCIN 250 MG PO TABS
250.0000 mg | ORAL_TABLET | Freq: Every day | ORAL | 0 refills | Status: DC
  Filled 2022-11-20: qty 6, 5d supply, fill #0

## 2022-11-20 MED ORDER — HYDROCODONE BIT-HOMATROP MBR 5-1.5 MG/5ML PO SOLN
5.0000 mL | Freq: Four times a day (QID) | ORAL | 0 refills | Status: DC | PRN
  Filled 2022-11-20: qty 200, 10d supply, fill #0

## 2022-11-20 MED ORDER — PREDNISONE 5 MG PO TABS
ORAL_TABLET | ORAL | 0 refills | Status: DC
  Filled 2022-11-20: qty 21, 6d supply, fill #0

## 2022-12-19 IMAGING — CT CT CHEST W/O CM
1 of 2 series · 15 of 31 positions shown, 19 images · non-contrast
Comparison: 07/16/2021, 02/17/2013, 05/04/2012

CLINICAL DATA: Lung nodule, follow-up examination



[Series 6: super d · axial · 0.68mm/px · z∈[-566,-263]mm · 15 of 424 slices shown, 19 images]
[im 23/424  mediastinal]
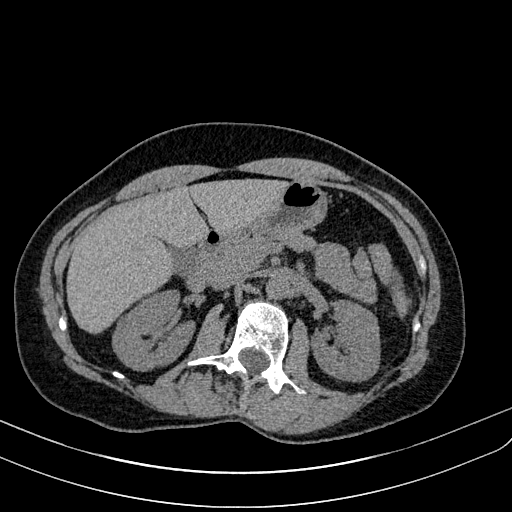
[im 23/424  lung]
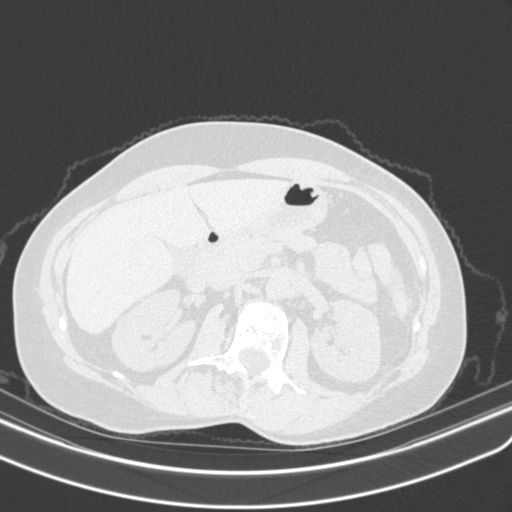
[im 67/424  lung]
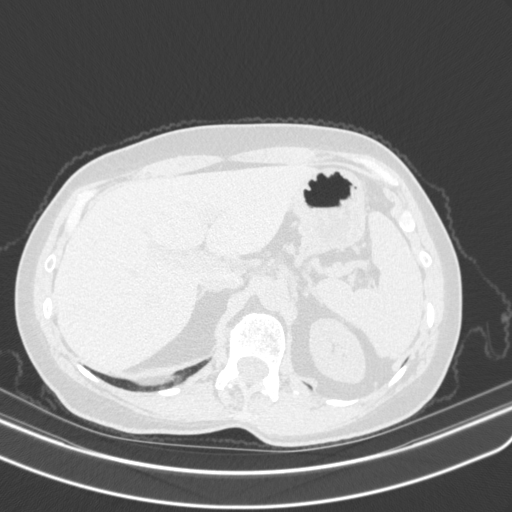
[im 90/424  lung]
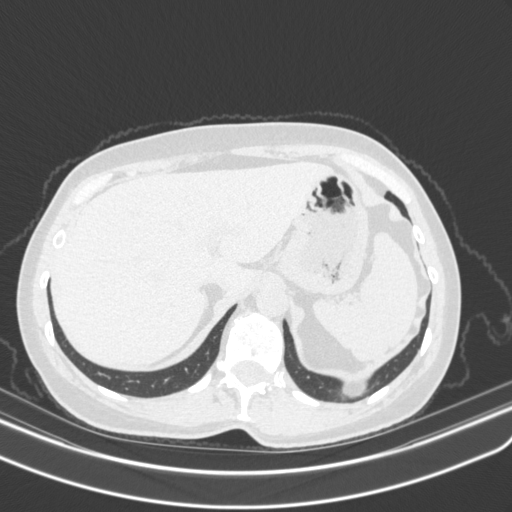
[im 112/424  lung]
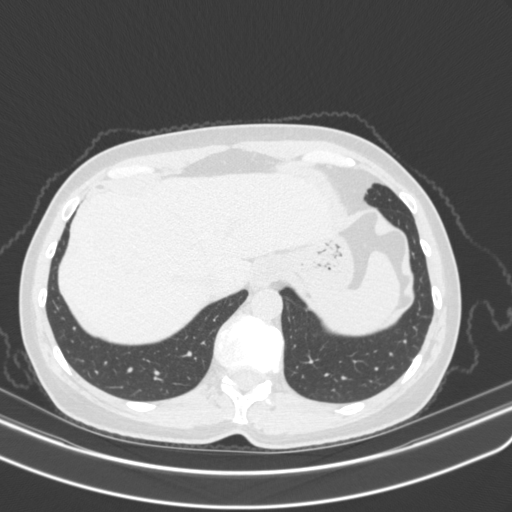
[im 142/424  mediastinal]
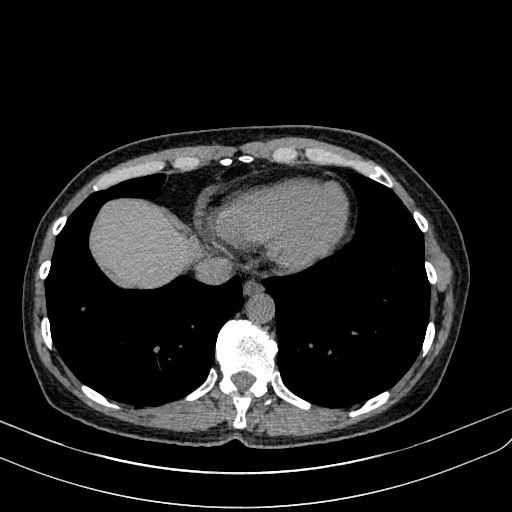
[im 142/424  lung]
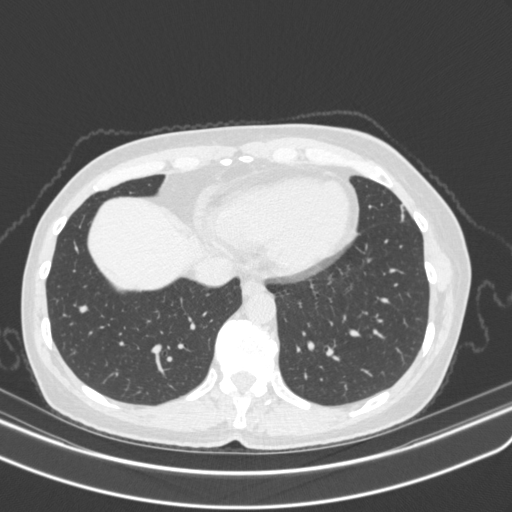
[im 156/424  lung]
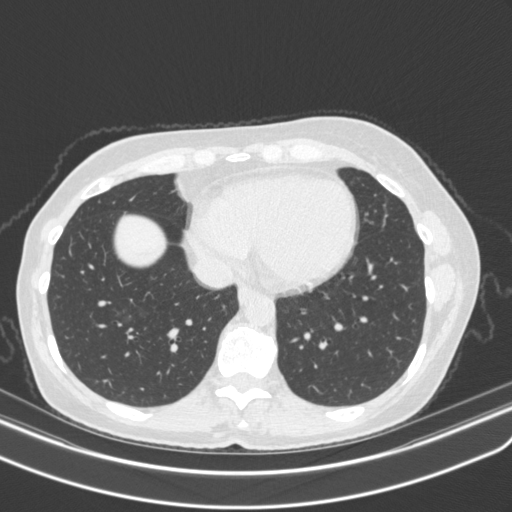
[im 179/424  lung]
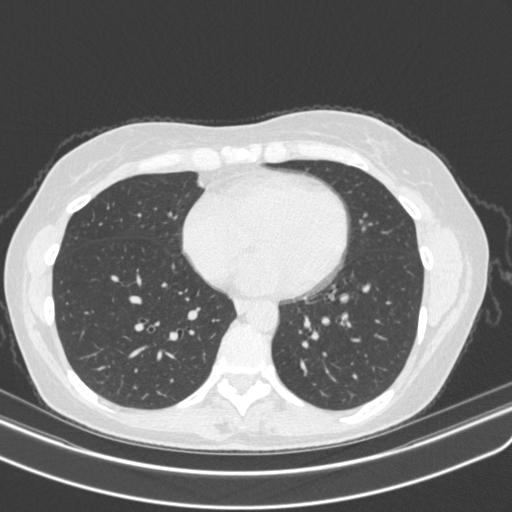
[im 223/424  lung]
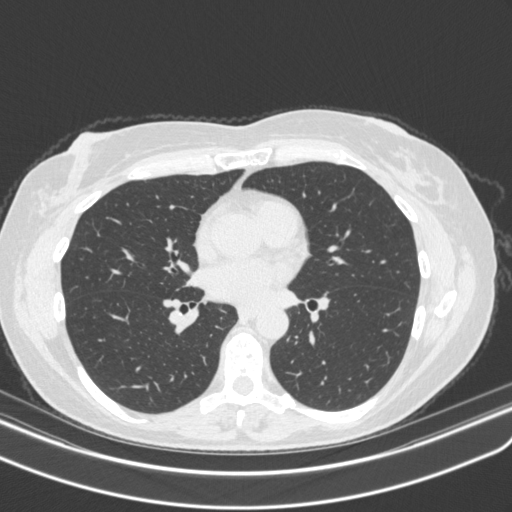
[im 245/424  mediastinal]
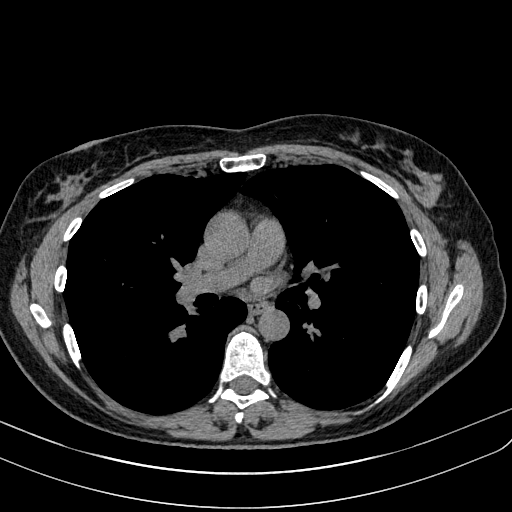
[im 245/424  lung]
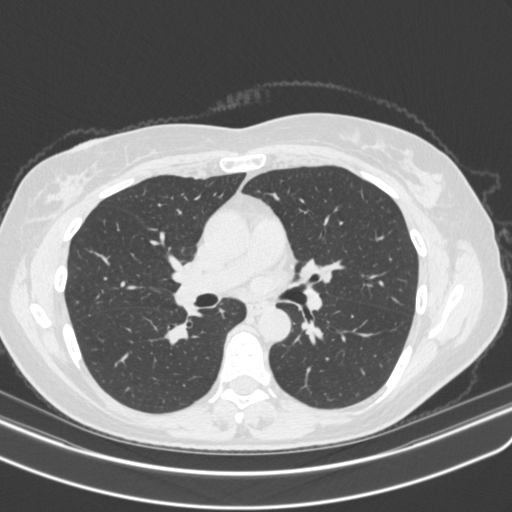
[im 268/424  lung]
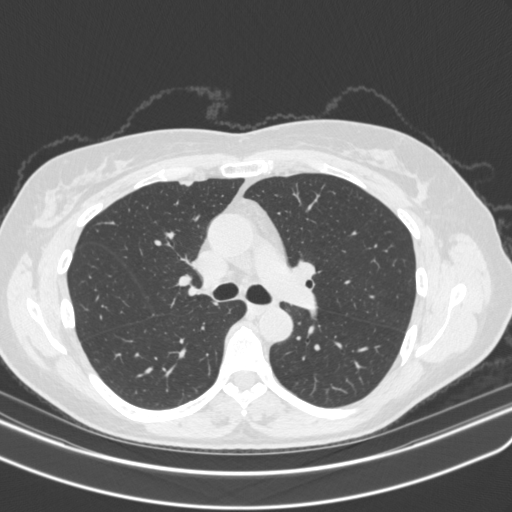
[im 290/424  lung]
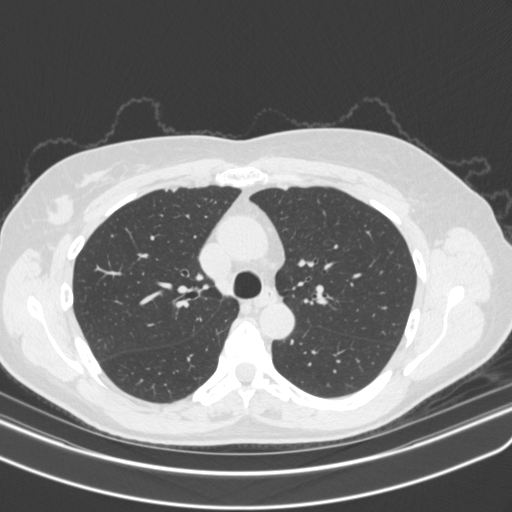
[im 312/424  lung]
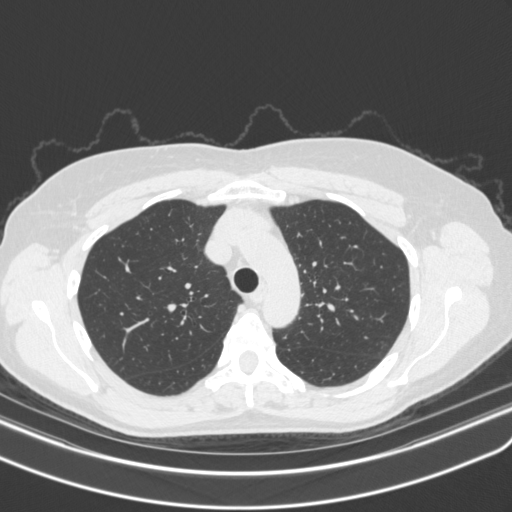
[im 334/424  mediastinal]
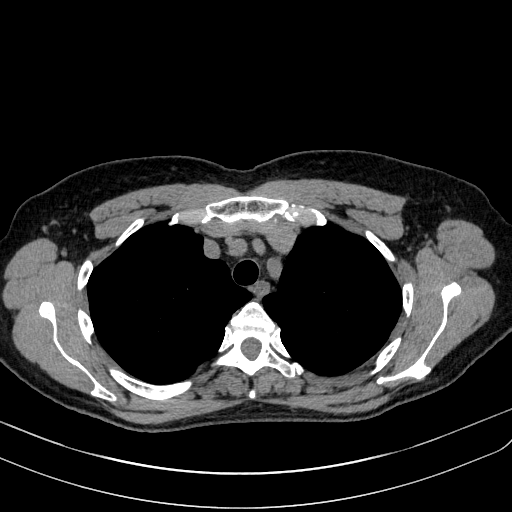
[im 334/424  lung]
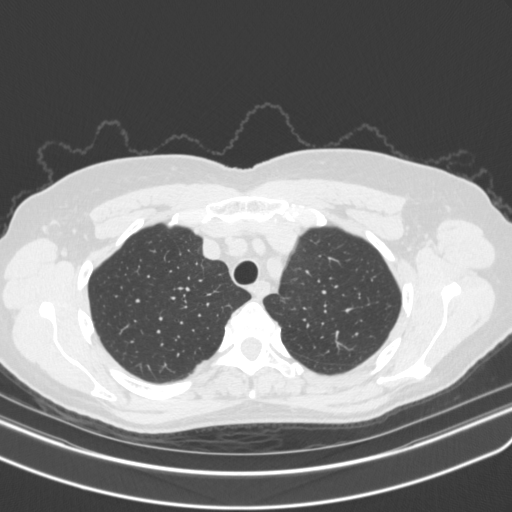
[im 379/424  lung]
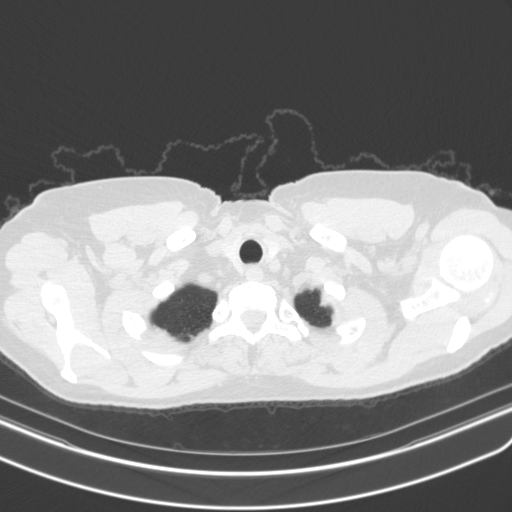
[im 401/424  lung]
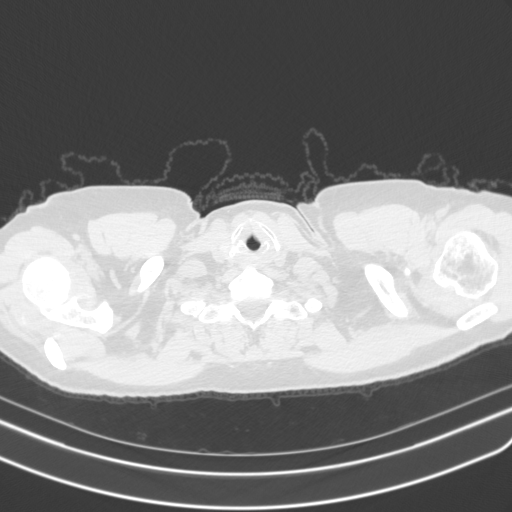

[15 of 31 positions shown; findings below may reference images not displayed]

FINDINGS: Cardiovascular: No significant vascular findings. Normal heart size.
No pericardial effusion.

Mediastinum/Nodes: No enlarged mediastinal or axillary lymph nodes.
Thyroid gland, trachea, and esophagus demonstrate no significant
findings.

Lungs/Pleura: 6 mm subpleural triangular shaped pulmonary nodule
within the right costophrenic angle appears stable since remote
prior examination of 05/04/2012 and is most in keeping with an
intrapulmonary lymph node. Subpleural nodular densities within the
left lower lobe, lingula, and right middle lobe, similarly, are
unchanged and are safely considered benign. No suspicious or
indeterminate pulmonary nodules. No pneumothorax or pleural
effusion. Central airways are widely patent.

Upper Abdomen: 4 mm nonobstructing calculus noted within the
visualized interpolar region of the right kidney. No acute
abnormality within the visualized abdomen.

Musculoskeletal: No acute bone abnormality.
IMPRESSION: Stable pulmonary nodule since remote prior examination of
05/04/2012, safely considered benign. No suspicious or indeterminate
pulmonary nodules identified.

Minimal right nonobstructing nephrolithiasis.

## 2023-04-25 ENCOUNTER — Other Ambulatory Visit (HOSPITAL_COMMUNITY): Payer: Self-pay

## 2023-04-25 MED ORDER — ROSUVASTATIN CALCIUM 20 MG PO TABS
20.0000 mg | ORAL_TABLET | Freq: Every day | ORAL | 3 refills | Status: AC
  Filled 2023-04-25: qty 90, 90d supply, fill #0

## 2023-04-28 DIAGNOSIS — L820 Inflamed seborrheic keratosis: Secondary | ICD-10-CM | POA: Diagnosis not present

## 2023-05-05 DIAGNOSIS — H10413 Chronic giant papillary conjunctivitis, bilateral: Secondary | ICD-10-CM | POA: Diagnosis not present

## 2023-05-05 DIAGNOSIS — H2513 Age-related nuclear cataract, bilateral: Secondary | ICD-10-CM | POA: Diagnosis not present

## 2023-05-13 ENCOUNTER — Other Ambulatory Visit (HOSPITAL_COMMUNITY): Payer: Self-pay

## 2023-05-13 ENCOUNTER — Other Ambulatory Visit: Payer: Self-pay

## 2023-05-13 DIAGNOSIS — Z85048 Personal history of other malignant neoplasm of rectum, rectosigmoid junction, and anus: Secondary | ICD-10-CM

## 2023-05-13 DIAGNOSIS — Z800 Family history of malignant neoplasm of digestive organs: Secondary | ICD-10-CM

## 2023-05-13 MED ORDER — NA SULFATE-K SULFATE-MG SULF 17.5-3.13-1.6 GM/177ML PO SOLN
ORAL | 0 refills | Status: DC
  Filled 2023-05-13: qty 354, 1d supply, fill #0

## 2023-05-26 ENCOUNTER — Encounter: Payer: Self-pay | Admitting: Internal Medicine

## 2023-05-26 DIAGNOSIS — N3641 Hypermobility of urethra: Secondary | ICD-10-CM | POA: Diagnosis not present

## 2023-06-05 ENCOUNTER — Encounter: Payer: Self-pay | Admitting: Internal Medicine

## 2023-06-05 ENCOUNTER — Ambulatory Visit (AMBULATORY_SURGERY_CENTER): Payer: Commercial Managed Care - PPO | Admitting: Internal Medicine

## 2023-06-05 VITALS — BP 98/60 | HR 87 | Temp 97.3°F | Resp 13 | Ht 62.0 in | Wt 125.0 lb

## 2023-06-05 DIAGNOSIS — Z800 Family history of malignant neoplasm of digestive organs: Secondary | ICD-10-CM

## 2023-06-05 DIAGNOSIS — Z08 Encounter for follow-up examination after completed treatment for malignant neoplasm: Secondary | ICD-10-CM | POA: Diagnosis not present

## 2023-06-05 DIAGNOSIS — Z85048 Personal history of other malignant neoplasm of rectum, rectosigmoid junction, and anus: Secondary | ICD-10-CM | POA: Diagnosis not present

## 2023-06-05 DIAGNOSIS — K317 Polyp of stomach and duodenum: Secondary | ICD-10-CM | POA: Diagnosis not present

## 2023-06-05 MED ORDER — SODIUM CHLORIDE 0.9 % IV SOLN: 500.0000 mL | Freq: Once | INTRAVENOUS | Status: DC

## 2023-06-05 NOTE — Progress Notes (Signed)
Sedate, gd SR, tolerated procedure well, VSS, report to RN 

## 2023-06-05 NOTE — Progress Notes (Signed)
HISTORY OF PRESENT ILLNESS:  Cheryl Holland is a 62 y.o. female with a personal history of rectal cancer and adenomatous as well as sessile serrated colon polyps.  Also family history of gastric cancer in her mother.  She presents today for surveillance colonoscopy and screening upper endoscopy.  REVIEW OF SYSTEMS:  All non-GI ROS negative. Past Medical History:  Diagnosis Date   Cyst of right kidney    PER CT REPORT 10/21/11   Gallstones    SEEN ON CT REPORT 10/21/11   GERD (gastroesophageal reflux disease)    Hyperlipidemia    Pulmonary nodules    SEEN ON CT REPORT 10/21/11--AND NOTE FROM DR. Kaydynce Pat THAT PT AND HUSBAND NOTIFIED--PT MAY NEED FOLLOW UP CT CHEST IN 6 MONTHS   Rectal cancer in proximal rectal polyp s/p polypectomy 10/18/2011   SVD (spontaneous vaginal delivery) x 3    Past Surgical History:  Procedure Laterality Date   BIOPSY BREAST Left    COLONOSCOPY  10/2016   CYSTOCELE REPAIR N/A 11/15/2019   Procedure: ANTERIOR REPAIR (CYSTOCELE);  Surgeon: Marcelle Overlie, MD;  Location: [facility];  Service: Gynecology;  Laterality: N/A;   EUS  10/18/2011   Procedure: LOWER ENDOSCOPIC ULTRASOUND (EUS);  Surgeon: Rob Bunting, MD;  Location: [facility];  Service: Endoscopy;  Laterality: N/A;  tattoo,  moderate sedation, TR   FOOT GANGLION EXCISION  2004 - approximate   right   INGUINAL HERNIA REPAIR  2001   right   LAPAROSCOPIC VAGINAL HYSTERECTOMY WITH SALPINGO OOPHORECTOMY Bilateral 11/15/2019   Procedure: LAPAROSCOPIC ASSISTED VAGINAL HYSTERECTOMY WITH SALPINGO OOPHORECTOMY;  Surgeon: Marcelle Overlie, MD;  Location: [facility];  Service: Gynecology;  Laterality: Bilateral;   MULTIPLE TOOTH EXTRACTIONS     PARTIAL PROCTECTOMY  10/30/2011   TEM resection of polyp base, path = [id number]   POLYPECTOMY     PROCTOSCOPY  10/30/2011   Procedure: PROCTOSCOPY;  Surgeon: Ardeth Sportsman, MD;  Location: [facility];  Service: General;  Laterality: N/A;    Social  History Glenna M Cumbie  reports that she has never smoked. She has never used smokeless tobacco. She reports current alcohol use of about 3.0 - 4.0 standard drinks of alcohol per week. She reports that she does not use drugs.  family history includes Dementia in her father; Stomach cancer in her mother.  Allergies  Allergen Reactions   Shrimp [Shellfish Allergy] Anaphylaxis, Hives and Swelling       PHYSICAL EXAMINATION: Vital signs: BP 103/71   Pulse 85   Temp (!) 97.3 F (36.3 C) (Skin)   Ht 5\' 2"  (1.575 m)   Wt 125 lb (56.7 kg)   SpO2 98%   BMI 22.86 kg/m  General: Well-developed, well-nourished, no acute distress HEENT: Sclerae are anicteric, conjunctiva pink. Oral mucosa intact Lungs: Clear Heart: Regular Abdomen: soft, nontender, nondistended, no obvious ascites, no peritoneal signs, normal bowel sounds. No organomegaly. Extremities: No edema Psychiatric: alert and oriented x3. Cooperative     ASSESSMENT:  1.  Personal history of colon cancer 2.  Personal history of adenomatous and sessile serrated polyps 3.  Family history of gastric cancer    PLAN:  1.  Surveillance colonoscopy 2.  Screening upper endoscopy

## 2023-06-05 NOTE — Op Note (Signed)
[facility] Patient Name: Cheryl Holland Procedure Date: 06/05/2023 1:54 PM MRN: [id number] Endoscopist: Wilhemina Bonito. Marina Goodell , MD, [id number] Age: 62 Referring MD:  Date of Birth: 1961-05-07 Gender: Female Account #: [id number] Procedure:                Upper GI endoscopy with biopsies Indications:              Personal history of malignant gastric neoplasm Medicines:                Monitored Anesthesia Care Procedure:                Pre-Anesthesia Assessment:                           - Prior to the procedure, a History and Physical                            was performed, and patient medications and                            allergies were reviewed. The patient's tolerance of                            previous anesthesia was also reviewed. The risks                            and benefits of the procedure and the sedation                            options and risks were discussed with the patient.                            All questions were answered, and informed consent                            was obtained. Prior Anticoagulants: The patient has                            taken no anticoagulant or antiplatelet agents. ASA                            Grade Assessment: II - A patient with mild systemic                            disease. After reviewing the risks and benefits,                            the patient was deemed in satisfactory condition to                            undergo the procedure.                           After obtaining informed consent, the endoscope was  passed under direct vision. Throughout the                            procedure, the patient's blood pressure, pulse, and                            oxygen saturations were monitored continuously. The                            GIF [id number] #[id number] was introduced through the                            mouth, and advanced to the second part of duodenum.                             The upper GI endoscopy was accomplished without                            difficulty. The patient tolerated the procedure                            well. Scope In: Scope Out: Findings:                 The esophagus was normal.                           The stomach revealed a few small benign fundic                            gland type polyps. Biopsies were taken with a cold                            forceps for histology for confirmation. Separate                            biopsies were taken of the gastric antrum to rule                            out Helicobacter pylori. The stomach was otherwise                            normal                           The examined duodenum was normal.                           The cardia and gastric fundus were normal on                            retroflexion. Complications:            No immediate complications. Estimated Blood Loss:     Estimated blood loss: none. Impression:               -  Normal esophagus.                           - Normal stomach except for a few incidental benign                            fundic gland type polyps. Biopsied.                           - Normal examined duodenum. Recommendation:           - Patient has a contact number available for                            emergencies. The signs and symptoms of potential                            delayed complications were discussed with the                            patient. Return to normal activities tomorrow.                            Written discharge instructions were provided to the                            patient.                           - Resume previous diet.                           - Continue present medications.                           - Await pathology results. Wilhemina Bonito. Marina Goodell, MD 06/05/2023 2:45:00 PM This report has been signed electronically.

## 2023-06-05 NOTE — Op Note (Signed)
[facility] Patient Name: Cheryl Holland Procedure Date: 06/05/2023 1:53 PM MRN: [id number] Endoscopist: Wilhemina Bonito. Marina Goodell , MD, [id number] Age: 62 Referring MD:  Date of Birth: 01-31-1961 Gender: Female Account #: [id number] Procedure:                Colonoscopy Indications:              High risk colon cancer surveillance: Personal                            history of colon cancer (rectal cancer 2013.                            Malignant polyp removed endoscopically. Negative                            subsequent transanal resection for residual tumor).                            Multiple surveillance examinations since. Has had                            diminutive TA's and SSP's. Medicines:                Monitored Anesthesia Care Procedure:                Pre-Anesthesia Assessment:                           - Prior to the procedure, a History and Physical                            was performed, and patient medications and                            allergies were reviewed. The patient's tolerance of                            previous anesthesia was also reviewed. The risks                            and benefits of the procedure and the sedation                            options and risks were discussed with the patient.                            All questions were answered, and informed consent                            was obtained. Prior Anticoagulants: The patient has                            taken no anticoagulant or antiplatelet agents. ASA  Grade Assessment: II - A patient with mild systemic                            disease. After reviewing the risks and benefits,                            the patient was deemed in satisfactory condition to                            undergo the procedure.                           After obtaining informed consent, the colonoscope                            was passed under direct vision. Throughout  the                            procedure, the patient's blood pressure, pulse, and                            oxygen saturations were monitored continuously. The                            Olympus Scope [id number] was introduced through the                            anus and advanced to the the cecum, identified by                            appendiceal orifice and ileocecal valve. The                            ileocecal valve, appendiceal orifice, and rectum                            were photographed. The quality of the bowel                            preparation was excellent. The colonoscopy was                            performed without difficulty. The patient tolerated                            the procedure well. The bowel preparation used was                            SUPREP via split dose instruction. Scope In: 2:11:59 PM Scope Out: 2:24:59 PM Scope Withdrawal Time: 0 hours 8 minutes 49 seconds  Total Procedure Duration: 0 hours 13 minutes 0 seconds  Findings:                 A few diverticula were found in the sigmoid colon  and ascending colon.                           The area of prior resection of the rectum revealed                            scar tissue, but was otherwise normal. The entire                            exam was otherwise without abnormality on direct                            and retroflexion views. Complications:            No immediate complications. Estimated blood loss:                            None. Estimated Blood Loss:     Estimated blood loss: none. Impression:               - Diverticulosis in the sigmoid colon and in the                            ascending colon.                           - The examination was otherwise normal on direct                            and retroflexion views.                           - No specimens collected. Recommendation:           - Repeat colonoscopy in 5 years for  surveillance.                           - Patient has a contact number available for                            emergencies. The signs and symptoms of potential                            delayed complications were discussed with the                            patient. Return to normal activities tomorrow.                            Written discharge instructions were provided to the                            patient.                           - Resume previous diet.                           -  Continue present medications. Wilhemina Bonito. Marina Goodell, MD 06/05/2023 2:33:14 PM This report has been signed electronically.

## 2023-06-05 NOTE — Patient Instructions (Addendum)
Resume previous diet Continue present medications Await pathology results There were no colon polyps seen today!  You will need another screening colonoscopy in 5 years, you will receive a letter at that time when you are due for the procedure.   Please call us at [phone] if you have a change in bowel habits, change in family history of colo-rectal cancer, rectal bleeding or other GI concern before that time.  Handouts/information given for diverticulosis and gastric polyps  YOU HAD AN ENDOSCOPIC PROCEDURE TODAY AT THE [facility]:   Refer to the procedure report that was given to you for any specific questions about what was found during the examination.  If the procedure report does not answer your questions, please call your gastroenterologist to clarify.  If you requested that your care partner not be given the details of your procedure findings, then the procedure report has been included in a sealed envelope for you to review at your convenience later.  YOU SHOULD EXPECT: Some feelings of bloating in the abdomen. Passage of more gas than usual.  Walking can help get rid of the air that was put into your GI tract during the procedure and reduce the bloating. If you had a lower endoscopy (such as a colonoscopy or flexible sigmoidoscopy) you may notice spotting of blood in your stool or on the toilet paper. If you underwent a bowel prep for your procedure, you may not have a normal bowel movement for a few days.  Please Note:  You might notice some irritation and congestion in your nose or some drainage.  This is from the oxygen used during your procedure.  There is no need for concern and it should clear up in a day or so.  SYMPTOMS TO REPORT IMMEDIATELY:  Following lower endoscopy (colonoscopy):  Excessive amounts of blood in the stool  Significant tenderness or worsening of abdominal pains  Swelling of the abdomen that is new, acute  Fever of 100F or higher  Following  upper endoscopy (EGD)  Vomiting of blood or coffee ground material  New chest pain or pain under the shoulder blades  Painful or persistently difficult swallowing  New shortness of breath  Black, tarry-looking stools  For urgent or emergent issues, a gastroenterologist can be reached at any hour by calling [phone]. Do not use MyChart messaging for urgent concerns.   DIET:  We do recommend a small meal at first, but then you may proceed to your regular diet.  Drink plenty of fluids but you should avoid alcoholic beverages for 24 hours.  ACTIVITY:  You should plan to take it easy for the rest of today and you should NOT DRIVE or use heavy machinery until tomorrow (because of the sedation medicines used during the test).    FOLLOW UP: Our staff will call the number listed on your records the next business day following your procedure.  We will call around 7:15- 8:00 am to check on you and address any questions or concerns that you may have regarding the information given to you following your procedure. If we do not reach you, we will leave a message.     If any biopsies were taken you will be contacted by phone or by letter within the next 1-3 weeks.  Please call us at [phone] if you have not heard about the biopsies in 3 weeks.    SIGNATURES/CONFIDENTIALITY: You and/or your care partner have signed paperwork which will be entered into your electronic  medical record.  These signatures attest to the fact that that the information above on your After Visit Summary has been reviewed and is understood.  Full responsibility of the confidentiality of this discharge information lies with you and/or your care-partner.

## 2023-06-05 NOTE — Progress Notes (Signed)
Pt's states no medical or surgical changes since previsit or office visit. 

## 2023-06-09 ENCOUNTER — Telehealth: Payer: Self-pay

## 2023-06-09 NOTE — Telephone Encounter (Signed)
Attempted f/u call. No answer, left VM.

## 2023-06-13 ENCOUNTER — Encounter: Payer: Self-pay | Admitting: Internal Medicine

## 2023-06-13 LAB — SURGICAL PATHOLOGY

## 2023-06-25 ENCOUNTER — Other Ambulatory Visit (HOSPITAL_COMMUNITY): Payer: Self-pay

## 2023-06-25 DIAGNOSIS — Z1231 Encounter for screening mammogram for malignant neoplasm of breast: Secondary | ICD-10-CM | POA: Diagnosis not present

## 2023-06-25 DIAGNOSIS — N898 Other specified noninflammatory disorders of vagina: Secondary | ICD-10-CM | POA: Diagnosis not present

## 2023-06-25 MED ORDER — FLUCONAZOLE 150 MG PO TABS
150.0000 mg | ORAL_TABLET | ORAL | 0 refills | Status: DC
  Filled 2023-06-25: qty 3, 9d supply, fill #0

## 2023-06-30 DIAGNOSIS — Z0000 Encounter for general adult medical examination without abnormal findings: Secondary | ICD-10-CM | POA: Diagnosis not present

## 2023-06-30 DIAGNOSIS — R002 Palpitations: Secondary | ICD-10-CM | POA: Diagnosis not present

## 2023-06-30 DIAGNOSIS — Z0189 Encounter for other specified special examinations: Secondary | ICD-10-CM | POA: Diagnosis not present

## 2023-06-30 DIAGNOSIS — J329 Chronic sinusitis, unspecified: Secondary | ICD-10-CM | POA: Diagnosis not present

## 2023-06-30 DIAGNOSIS — E785 Hyperlipidemia, unspecified: Secondary | ICD-10-CM | POA: Diagnosis not present

## 2023-07-07 DIAGNOSIS — E785 Hyperlipidemia, unspecified: Secondary | ICD-10-CM | POA: Diagnosis not present

## 2023-07-07 DIAGNOSIS — Z85048 Personal history of other malignant neoplasm of rectum, rectosigmoid junction, and anus: Secondary | ICD-10-CM | POA: Diagnosis not present

## 2023-07-07 DIAGNOSIS — H43811 Vitreous degeneration, right eye: Secondary | ICD-10-CM | POA: Diagnosis not present

## 2023-07-07 DIAGNOSIS — H10413 Chronic giant papillary conjunctivitis, bilateral: Secondary | ICD-10-CM | POA: Diagnosis not present

## 2023-07-07 DIAGNOSIS — R82998 Other abnormal findings in urine: Secondary | ICD-10-CM | POA: Diagnosis not present

## 2023-07-07 DIAGNOSIS — Z23 Encounter for immunization: Secondary | ICD-10-CM | POA: Diagnosis not present

## 2023-07-07 DIAGNOSIS — Z008 Encounter for other general examination: Secondary | ICD-10-CM | POA: Diagnosis not present

## 2023-07-07 DIAGNOSIS — Z1331 Encounter for screening for depression: Secondary | ICD-10-CM | POA: Diagnosis not present

## 2023-07-07 DIAGNOSIS — Z1339 Encounter for screening examination for other mental health and behavioral disorders: Secondary | ICD-10-CM | POA: Diagnosis not present

## 2023-07-07 DIAGNOSIS — K219 Gastro-esophageal reflux disease without esophagitis: Secondary | ICD-10-CM | POA: Diagnosis not present

## 2023-07-07 DIAGNOSIS — H2513 Age-related nuclear cataract, bilateral: Secondary | ICD-10-CM | POA: Diagnosis not present

## 2023-07-24 ENCOUNTER — Other Ambulatory Visit (HOSPITAL_COMMUNITY): Payer: Self-pay

## 2023-07-24 DIAGNOSIS — Z13820 Encounter for screening for osteoporosis: Secondary | ICD-10-CM | POA: Diagnosis not present

## 2023-07-24 DIAGNOSIS — Z124 Encounter for screening for malignant neoplasm of cervix: Secondary | ICD-10-CM | POA: Diagnosis not present

## 2023-07-24 DIAGNOSIS — Z01419 Encounter for gynecological examination (general) (routine) without abnormal findings: Secondary | ICD-10-CM | POA: Diagnosis not present

## 2023-07-24 DIAGNOSIS — Z6824 Body mass index (BMI) 24.0-24.9, adult: Secondary | ICD-10-CM | POA: Diagnosis not present

## 2023-07-24 DIAGNOSIS — N952 Postmenopausal atrophic vaginitis: Secondary | ICD-10-CM | POA: Diagnosis not present

## 2023-07-24 MED ORDER — PREMARIN 0.625 MG/GM VA CREA
0.5000 | TOPICAL_CREAM | VAGINAL | 3 refills | Status: AC
  Filled 2023-07-24: qty 30, 70d supply, fill #0
  Filled 2024-02-03: qty 30, 70d supply, fill #1

## 2023-08-04 ENCOUNTER — Other Ambulatory Visit (HOSPITAL_COMMUNITY): Payer: Self-pay

## 2023-08-04 DIAGNOSIS — H43811 Vitreous degeneration, right eye: Secondary | ICD-10-CM | POA: Diagnosis not present

## 2023-08-04 DIAGNOSIS — H35411 Lattice degeneration of retina, right eye: Secondary | ICD-10-CM | POA: Diagnosis not present

## 2023-10-08 DIAGNOSIS — H43811 Vitreous degeneration, right eye: Secondary | ICD-10-CM | POA: Diagnosis not present

## 2023-10-08 DIAGNOSIS — H35411 Lattice degeneration of retina, right eye: Secondary | ICD-10-CM | POA: Diagnosis not present

## 2023-11-19 ENCOUNTER — Other Ambulatory Visit (HOSPITAL_COMMUNITY): Payer: Self-pay

## 2023-11-19 MED ORDER — AMOXICILLIN-POT CLAVULANATE 875-125 MG PO TABS
1.0000 | ORAL_TABLET | Freq: Two times a day (BID) | ORAL | 0 refills | Status: AC
  Filled 2023-11-19: qty 20, 10d supply, fill #0

## 2023-12-25 ENCOUNTER — Other Ambulatory Visit (HOSPITAL_COMMUNITY): Payer: Self-pay

## 2023-12-25 DIAGNOSIS — Z91013 Allergy to seafood: Secondary | ICD-10-CM | POA: Diagnosis not present

## 2023-12-25 DIAGNOSIS — R051 Acute cough: Secondary | ICD-10-CM | POA: Diagnosis not present

## 2023-12-25 DIAGNOSIS — J0111 Acute recurrent frontal sinusitis: Secondary | ICD-10-CM | POA: Diagnosis not present

## 2023-12-25 MED ORDER — HYDROCODONE BIT-HOMATROP MBR 5-1.5 MG/5ML PO SOLN
5.0000 mL | Freq: Four times a day (QID) | ORAL | 0 refills | Status: AC | PRN
Start: 2023-12-25 — End: ?
  Filled 2023-12-25: qty 200, 10d supply, fill #0

## 2023-12-25 MED ORDER — AZITHROMYCIN 250 MG PO TABS
ORAL_TABLET | ORAL | 0 refills | Status: AC
  Filled 2023-12-25: qty 6, 5d supply, fill #0

## 2023-12-25 MED ORDER — EPINEPHRINE 0.3 MG/0.3ML IJ SOAJ
0.3000 mg | INTRAMUSCULAR | 0 refills | Status: AC | PRN
  Filled 2023-12-25: qty 2, 2d supply, fill #0

## 2024-02-03 ENCOUNTER — Other Ambulatory Visit (HOSPITAL_COMMUNITY): Payer: Self-pay

## 2024-02-04 ENCOUNTER — Other Ambulatory Visit: Payer: Self-pay

## 2024-02-04 ENCOUNTER — Other Ambulatory Visit (HOSPITAL_COMMUNITY): Payer: Self-pay

## 2024-02-04 MED ORDER — OMEPRAZOLE 20 MG PO CPDR
20.0000 mg | DELAYED_RELEASE_CAPSULE | Freq: Every day | ORAL | 3 refills | Status: AC
  Filled 2024-02-04: qty 90, 90d supply, fill #0

## 2024-02-11 DIAGNOSIS — H35411 Lattice degeneration of retina, right eye: Secondary | ICD-10-CM | POA: Diagnosis not present

## 2024-02-11 DIAGNOSIS — H2513 Age-related nuclear cataract, bilateral: Secondary | ICD-10-CM | POA: Diagnosis not present

## 2024-02-11 DIAGNOSIS — H43811 Vitreous degeneration, right eye: Secondary | ICD-10-CM | POA: Diagnosis not present

## 2024-02-11 DIAGNOSIS — H10413 Chronic giant papillary conjunctivitis, bilateral: Secondary | ICD-10-CM | POA: Diagnosis not present
